# Patient Record
Sex: Male | Born: 1935 | Race: White | Hispanic: No | Marital: Married | Smoking: Former smoker
Health system: Southern US, Community
[De-identification: ages and names within clinical notes are randomized; demographics above are authoritative.]

## PROBLEM LIST (undated history)

## (undated) DIAGNOSIS — E785 Hyperlipidemia, unspecified: Secondary | ICD-10-CM

## (undated) DIAGNOSIS — I77810 Thoracic aortic ectasia: Secondary | ICD-10-CM

## (undated) DIAGNOSIS — K5790 Diverticulosis of intestine, part unspecified, without perforation or abscess without bleeding: Secondary | ICD-10-CM

## (undated) DIAGNOSIS — N529 Male erectile dysfunction, unspecified: Secondary | ICD-10-CM

## (undated) DIAGNOSIS — E039 Hypothyroidism, unspecified: Secondary | ICD-10-CM

## (undated) DIAGNOSIS — Z9289 Personal history of other medical treatment: Secondary | ICD-10-CM

## (undated) DIAGNOSIS — I499 Cardiac arrhythmia, unspecified: Secondary | ICD-10-CM

## (undated) DIAGNOSIS — I493 Ventricular premature depolarization: Secondary | ICD-10-CM

## (undated) DIAGNOSIS — H9319 Tinnitus, unspecified ear: Secondary | ICD-10-CM

## (undated) DIAGNOSIS — I2510 Atherosclerotic heart disease of native coronary artery without angina pectoris: Secondary | ICD-10-CM

## (undated) DIAGNOSIS — E038 Other specified hypothyroidism: Secondary | ICD-10-CM

## (undated) DIAGNOSIS — N400 Enlarged prostate without lower urinary tract symptoms: Secondary | ICD-10-CM

## (undated) HISTORY — DX: Cardiac arrhythmia, unspecified: I49.9

## (undated) HISTORY — DX: Ventricular premature depolarization: I49.3

## (undated) HISTORY — DX: Male erectile dysfunction, unspecified: N52.9

## (undated) HISTORY — DX: Thoracic aortic ectasia: I77.810

## (undated) HISTORY — DX: Benign prostatic hyperplasia without lower urinary tract symptoms: N40.0

## (undated) HISTORY — DX: Hyperlipidemia, unspecified: E78.5

## (undated) HISTORY — DX: Diverticulosis of intestine, part unspecified, without perforation or abscess without bleeding: K57.90

## (undated) HISTORY — DX: Atherosclerotic heart disease of native coronary artery without angina pectoris: I25.10

## (undated) HISTORY — DX: Personal history of other medical treatment: Z92.89

## (undated) HISTORY — PX: CARDIAC CATHETERIZATION: SHX172

## (undated) HISTORY — DX: Tinnitus, unspecified ear: H93.19

## (undated) HISTORY — PX: OTHER SURGICAL HISTORY: SHX169

## (undated) HISTORY — DX: Other specified hypothyroidism: E03.8

## (undated) HISTORY — DX: Hypothyroidism, unspecified: E03.9

---

## 2000-02-23 ENCOUNTER — Emergency Department (HOSPITAL_COMMUNITY): Admission: EM | Admit: 2000-02-23 | Discharge: 2000-02-23 | Payer: Self-pay | Admitting: Emergency Medicine

## 2000-09-22 ENCOUNTER — Ambulatory Visit (HOSPITAL_COMMUNITY): Admission: RE | Admit: 2000-09-22 | Discharge: 2000-09-22 | Payer: Self-pay | Admitting: Gastroenterology

## 2009-04-16 ENCOUNTER — Emergency Department (HOSPITAL_COMMUNITY): Admission: EM | Admit: 2009-04-16 | Discharge: 2009-04-16 | Payer: Self-pay | Admitting: Emergency Medicine

## 2010-04-30 ENCOUNTER — Inpatient Hospital Stay (HOSPITAL_COMMUNITY): Admission: RE | Admit: 2010-04-30 | Discharge: 2010-05-01 | Payer: Self-pay | Admitting: Interventional Cardiology

## 2010-12-04 ENCOUNTER — Other Ambulatory Visit: Payer: Self-pay | Admitting: Interventional Cardiology

## 2010-12-04 DIAGNOSIS — I77810 Thoracic aortic ectasia: Secondary | ICD-10-CM

## 2010-12-20 ENCOUNTER — Ambulatory Visit
Admission: RE | Admit: 2010-12-20 | Discharge: 2010-12-20 | Disposition: A | Discharge status: Home / Self Care | Payer: MEDICARE | Source: Ambulatory Visit | Attending: Interventional Cardiology | Admitting: Interventional Cardiology

## 2010-12-20 DIAGNOSIS — I77810 Thoracic aortic ectasia: Secondary | ICD-10-CM

## 2010-12-20 MED ORDER — IOHEXOL 300 MG/ML  SOLN
75.0000 mL | Freq: Once | INTRAMUSCULAR | Status: AC | PRN
  Administered 2010-12-20: 75 mL via INTRAVENOUS

## 2011-01-30 LAB — PROTIME-INR
INR: 1.57 — ABNORMAL HIGH (ref 0.00–1.49)
Prothrombin Time: 16.9 seconds — ABNORMAL HIGH (ref 11.6–15.2)
Prothrombin Time: 18.6 seconds — ABNORMAL HIGH (ref 11.6–15.2)

## 2011-01-30 LAB — BASIC METABOLIC PANEL
BUN: 17 mg/dL (ref 6–23)
CO2: 27 mEq/L (ref 19–32)
Calcium: 8.6 mg/dL (ref 8.4–10.5)
Chloride: 106 mEq/L (ref 96–112)
Creatinine, Ser: 1.15 mg/dL (ref 0.4–1.5)
GFR calc Af Amer: >60 mL/min (ref >60)
GFR calc non Af Amer: >60 mL/min (ref >60)
Glucose, Bld: 104 mg/dL — ABNORMAL HIGH (ref 70–99)
Potassium: 3.8 mEq/L (ref 3.5–5.1)
Sodium: 139 mEq/L (ref 135–145)

## 2011-01-30 LAB — CBC
HCT: 40.8 % (ref 39.0–52.0)
Platelets: 152 10*3/uL (ref 150–400)
WBC: 6.1 10*3/uL (ref 4.0–10.5)

## 2011-06-27 ENCOUNTER — Other Ambulatory Visit: Payer: Self-pay | Admitting: Interventional Cardiology

## 2011-06-27 DIAGNOSIS — R918 Other nonspecific abnormal finding of lung field: Secondary | ICD-10-CM

## 2011-12-26 ENCOUNTER — Other Ambulatory Visit: Payer: Self-pay

## 2011-12-26 ENCOUNTER — Ambulatory Visit
Admission: RE | Admit: 2011-12-26 | Discharge: 2011-12-26 | Disposition: A | Discharge status: Home / Self Care | Payer: Medicare Other | Source: Ambulatory Visit | Attending: Interventional Cardiology | Admitting: Interventional Cardiology

## 2011-12-26 DIAGNOSIS — R918 Other nonspecific abnormal finding of lung field: Secondary | ICD-10-CM

## 2011-12-26 MED ORDER — IOHEXOL 300 MG/ML  SOLN
75.0000 mL | Freq: Once | INTRAMUSCULAR | Status: AC | PRN
  Administered 2011-12-26: 75 mL via INTRAVENOUS

## 2013-05-31 IMAGING — CT CT CHEST W/ CM
3 of 4 series · 15 of 30 positions shown, 16 images · IV contrast (omnipaque)
Comparison: CT chest of 12/20/2010

CLINICAL DATA: Evaluate prominent ascending aorta

CT CHEST WITH CONTRAST
TECHNIQUE: Multidetector CT imaging of the chest was performed
following the standard protocol during bolus administration of
intravenous contrast.
Contrast: 75mL OMNIPAQUE IOHEXOL 300 MG/ML IV SOLN

[Series 3: chest with · axial · 0.78mm/px · z∈[-218,-42]mm · 4 of 59 slices shown, 5 images]
[im 12/59  mediastinal]
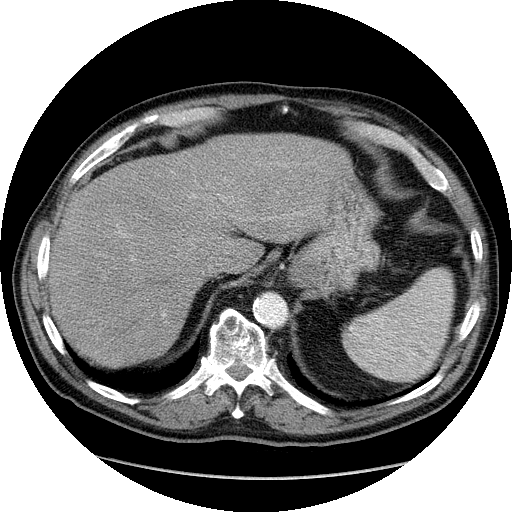
[im 12/59  lung]
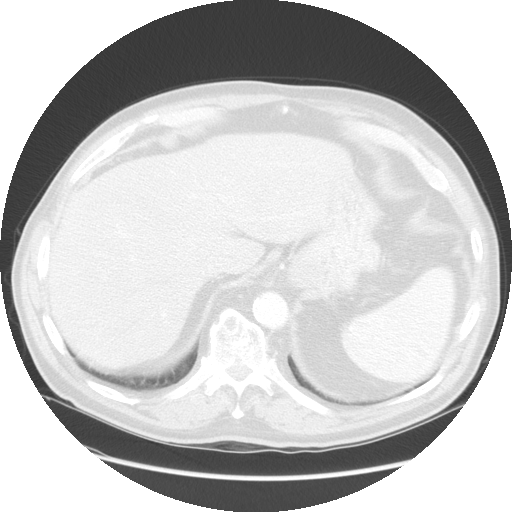
[im 24/59  lung]
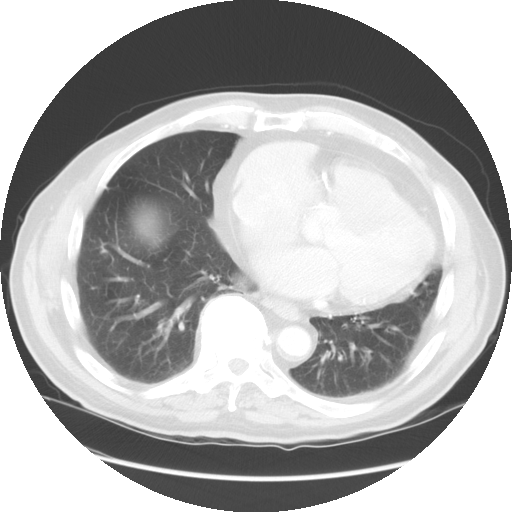
[im 35/59  lung]
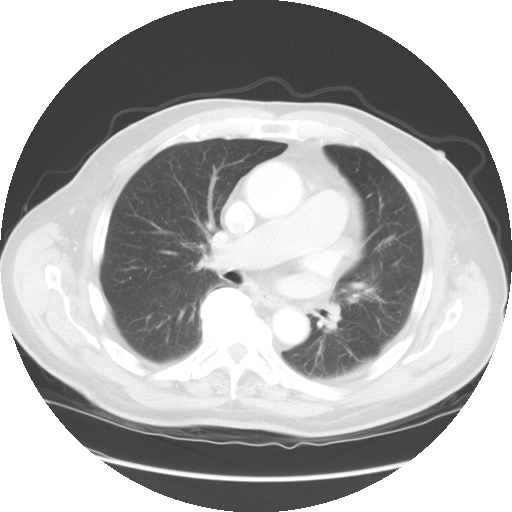
[im 47/59  lung]
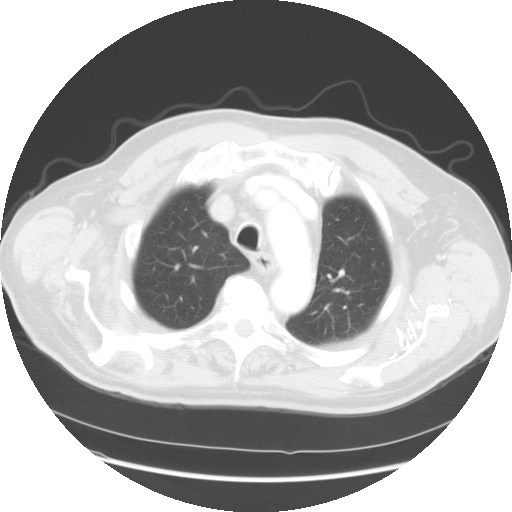

[Series 4: lung windows · axial · 0.78mm/px · z∈[-178,-48]mm · 3 of 53 slices shown]
[im 14/53  lung]
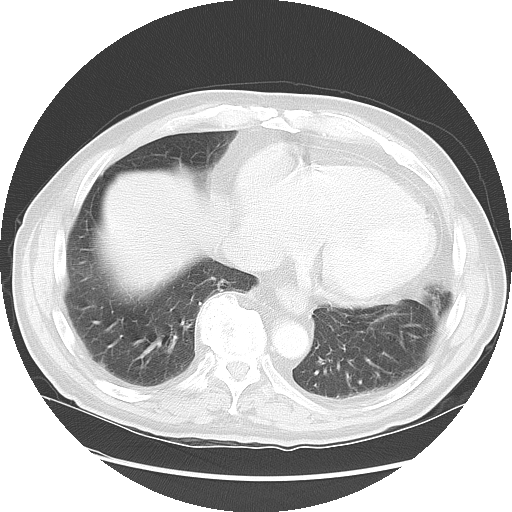
[im 27/53  lung]
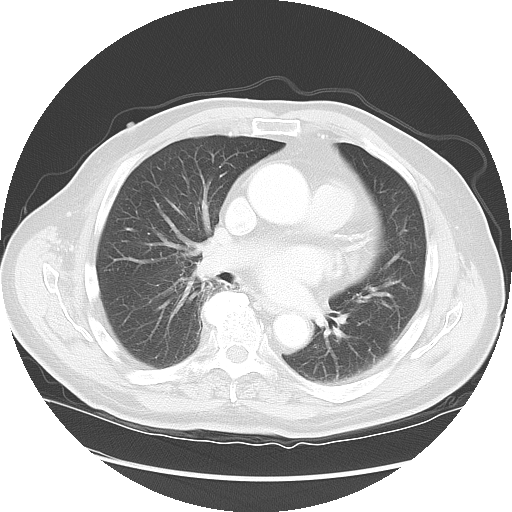
[im 40/53  lung]
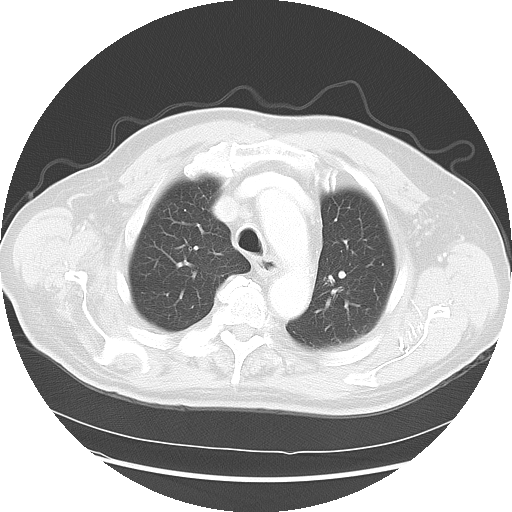

[Series 602: sagittal body · sagittal · 0.78mm/px · 8 of 161 slices shown]
[im 11/161  mediastinal]
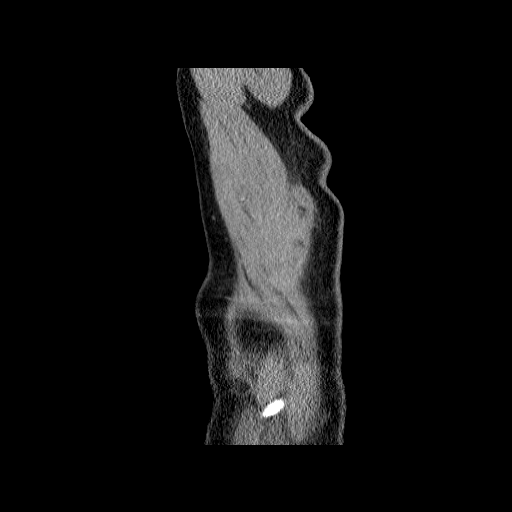
[im 31/161  mediastinal]
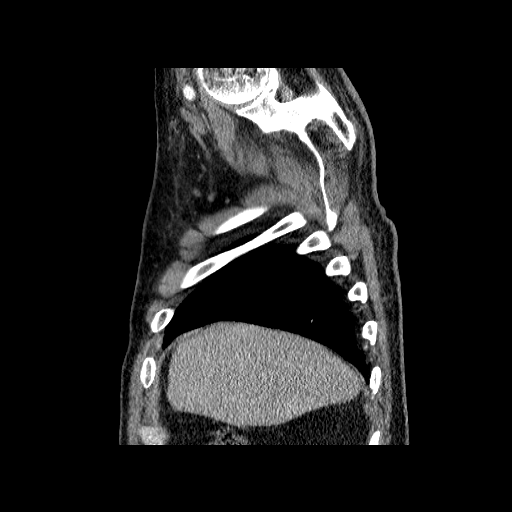
[im 51/161  mediastinal]
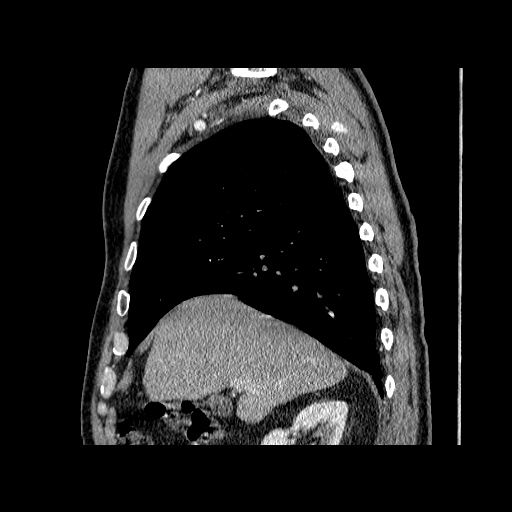
[im 71/161  mediastinal]
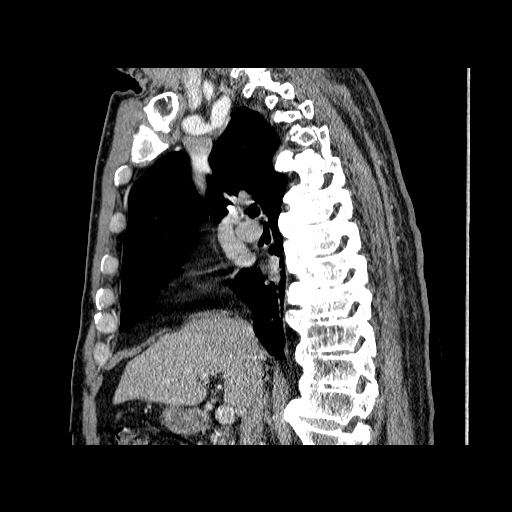
[im 91/161  mediastinal]
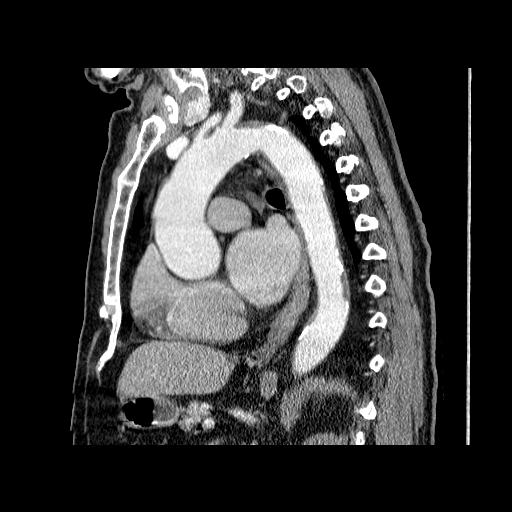
[im 111/161  mediastinal]
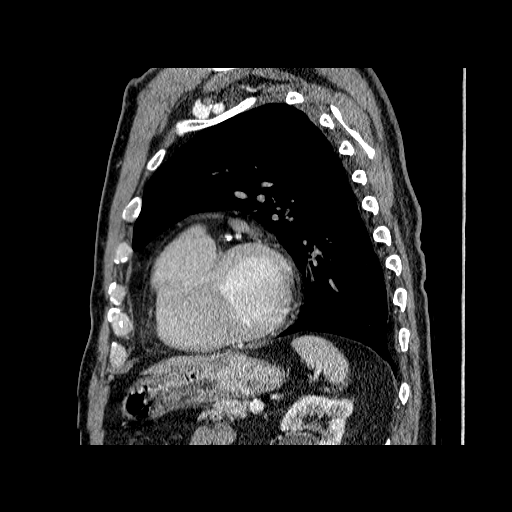
[im 131/161  mediastinal]
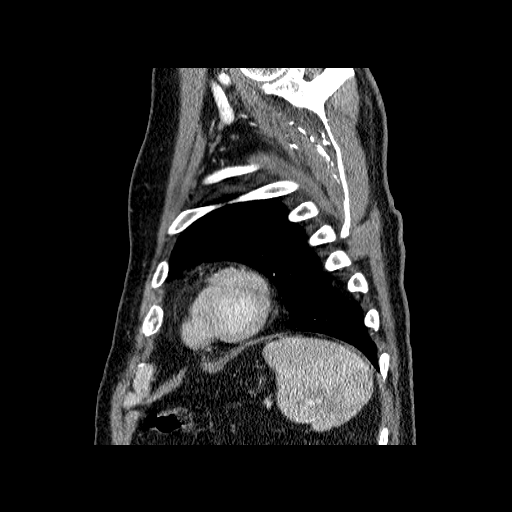
[im 151/161  mediastinal]
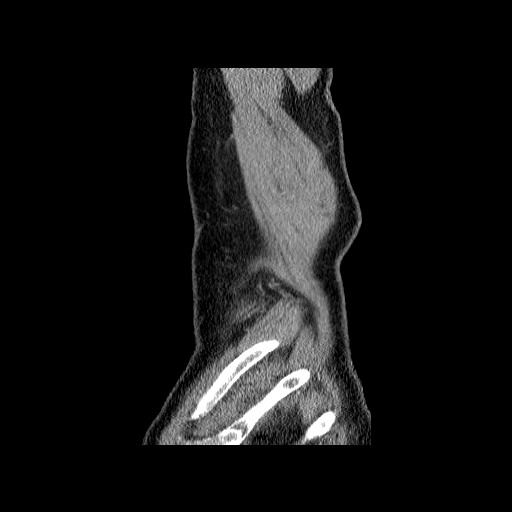

[15 of 30 positions shown; findings below may reference images not displayed]

FINDINGS: The slight fusiform prominence of the ascending aorta
appears stable with the ascending aorta measuring 44 mm compared to
45 mm on the prior study.  The aortic arch is normal in caliber
with some calcification of the aortic arch.  The origins of the
great vessels are patent.  Some atheromatous change is noted within
the descending thoracic aorta distally.  The pulmonary arteries
opacify with no acute abnormality and the pulmonary arteries are
again noted to be somewhat prominent suggesting a degree of
pulmonary arterial hypertension.  Faint coronary artery
calcifications are noted and cardiomegaly is stable.  No
mediastinal or hilar adenopathy is seen.  The thyroid gland is
normal in size and there is a small low attenuation nodule in the
left lobe of questionable significance in this age patient.

On the lung window images, no focal infiltrate is seen.
Bronchiectatic change is again noted within the right middle lobe
as previously described.  No pleural effusion is seen. There are
diffuse degenerative changes throughout the entire thoracic spine.
IMPRESSION: 1.  Stable slight fusiform prominence of the ascending aorta with
maximum diameter of 44 mm.
2.  Cardiomegaly and coronary artery calcifications.
3.  No change in bronchiectatic changes within the right middle
lobe as previously noted.

## 2013-12-03 ENCOUNTER — Other Ambulatory Visit: Payer: Self-pay | Admitting: Internal Medicine

## 2013-12-03 DIAGNOSIS — M25569 Pain in unspecified knee: Secondary | ICD-10-CM

## 2014-01-27 ENCOUNTER — Ambulatory Visit: Payer: Medicare Other | Admitting: Interventional Cardiology

## 2014-02-04 ENCOUNTER — Ambulatory Visit (INDEPENDENT_AMBULATORY_CARE_PROVIDER_SITE_OTHER): Payer: Medicare Other | Admitting: Interventional Cardiology

## 2014-02-04 ENCOUNTER — Encounter: Payer: Self-pay | Admitting: Interventional Cardiology

## 2014-02-04 ENCOUNTER — Encounter: Payer: Self-pay | Admitting: Cardiology

## 2014-02-04 VITALS — BP 130/78 | Ht 67.0 in | Wt 186.0 lb

## 2014-02-04 DIAGNOSIS — I4891 Unspecified atrial fibrillation: Secondary | ICD-10-CM

## 2014-02-04 DIAGNOSIS — I2510 Atherosclerotic heart disease of native coronary artery without angina pectoris: Secondary | ICD-10-CM

## 2014-02-04 DIAGNOSIS — E782 Mixed hyperlipidemia: Secondary | ICD-10-CM

## 2014-02-04 DIAGNOSIS — N400 Enlarged prostate without lower urinary tract symptoms: Secondary | ICD-10-CM | POA: Insufficient documentation

## 2014-02-04 MED ORDER — METOPROLOL TARTRATE 50 MG PO TABS: 50.0000 mg | ORAL_TABLET | Freq: Two times a day (BID) | ORAL | Status: DC

## 2014-02-04 NOTE — Patient Instructions (Signed)
Your physician has recommended you make the following change in your medication:   1. Restart Metoprolol 50 mg 1 tablet twice a day.  Your physician recommends that you return for a FASTING lipid profile and hepatic panel 02/06/14.  Your physician wants you to follow-up in: 1 year with Dr. Eldridge DaceVaranasi. You will receive a reminder letter in the mail two months in advance. If you don't receive a letter, please call our office to schedule the follow-up appointment.

## 2014-02-04 NOTE — Progress Notes (Signed)
Patient ID: Les P Gemme, male   DOB: 03/25/1930, 80 y.o.   MRN: [id number]    [address] [place], [place]  [address] [address] Phone: [phone] Fax:  [phone]  Date:  02/04/2014   ID:  Scharlene CornSpiros P Eicher, DOB 06/12/1930, MRN [id number]  PCP:  Default, Provider, MD      History of Present Illness: Kaidin P Fraser Dinixon is a 80 y.o. male who has had CAD, AFib. Atrial Fibrillation F/U:  Denies : Chest pain.  Dizziness.  Leg edema.  Orthopnea.  Palpitations.  Shortness of breath.  Syncope.   Still working, exercising and travelling.    Wt Readings from Last 3 Encounters:  02/04/14 186 lb (84.369 kg)     Past Medical History  Diagnosis Date  . Coronary artery disease     2 BMS STENTS LAD AND RCA 2011  . 427.31     afib  . BPH (benign prostatic hyperplasia)     Dr. Retta Dionesahlstedt  . Ascending aorta dilation   . Diverticulosis   . Hyperlipidemia     borderline  . History of echocardiogram     2007 Normal LV size, mild LVH, aortic valve sclerosis, mild diastolic relaxation abn and mild aortic root dilation  . PVC's (premature ventricular contractions)   . History of nuclear stress test     negative-done because of dyspnea  . Tinnitus     sees ENT  . ED (erectile dysfunction)   . Subclinical hypothyroidism     Current Outpatient Prescriptions  Medication Sig Dispense Refill  . aspirin 81 MG tablet Take 81 mg by mouth daily.      . cholecalciferol (VITAMIN D) 1000 UNITS tablet Take 1,000 Units by mouth daily.      Marland Kitchen. levothyroxine (SYNTHROID, LEVOTHROID) 25 MCG tablet Take 25 mcg by mouth daily before breakfast.       . lisinopril (PRINIVIL,ZESTRIL) 5 MG tablet Take 5 mg by mouth daily.       . nitroGLYCERIN (NITROSTAT) 0.4 MG SL tablet Place 0.4 mg under the tongue every 5 (five) minutes as needed for chest pain.      . simvastatin (ZOCOR) 20 MG tablet Take 20 mg by mouth daily at 6 PM.       . vitamin B-12 (CYANOCOBALAMIN) 1000 MCG tablet Take 1,000 mcg by mouth daily.       Marland Kitchen. warfarin (COUMADIN) 5 MG tablet Take 5 mg by mouth as directed.        No current facility-administered medications for this visit.    Allergies:   No Known Allergies  Social History:  The patient  reports that he has quit smoking. He does not have any smokeless tobacco history on file. He reports that he does not drink alcohol.   Family History:  The patient's family history includes Alcoholism in his father.   ROS:  Please see the history of present illness.  No nausea, vomiting.  No fevers, chills.  No focal weakness.  No dysuria.   All other systems reviewed and negative.   PHYSICAL EXAM: VS:  BP 130/78  Ht 5\' 7"  (1.702 m)  Wt 186 lb (84.369 kg)  BMI 29.12 kg/m2 Well nourished, well developed, in no acute distress HEENT: normal Neck: no JVD, no carotid bruits Cardiac:  normal S1, S2; irregularly irregular Lungs:  clear to auscultation bilaterally, no wheezing, rhonchi or rales Abd: soft, nontender, no hepatomegaly Ext: no edema Skin: warm and dry Neuro:   no focal abnormalities  noted  EKG:  AFib, rate controlled   ASSESSMENT AND PLAN:  Atrial fibrillation  Continue Warfarin Sodium Tablet, 5 MG, as directed, Orally, 2.5 mg on Sat/Tues, Thurs, 5mg  other days Refill Metoprolol Tartrate Tablet, 50.0 Milligram, TAKE 1 TABLET BY MOUTH TWICE DAILY, 90 days, 180, Refills 3 IMAGING: EKG   Harward,Amy 01/28/2013 01:41:59 PM > Tiffiany Beadles,JAY 01/28/2013 01:57:58 PM > AFib, controlled rate  No change from 2014 ECG Notes: Rate controlled. COumadin for stroke prevention.  2. Coronary atherosclerosis of native coronary artery  Continue Nitroglycerin 0.4 mg tablet, 0.4 mg, 1 tablet as directed, SL, as directed prn chest pain, 1 vial, Refills 6 Notes: No angina.  3. Mixed hyperlipidemia  Refill Simvastatin Tablet, 20 Milligram, TAKE 1 TABLET BY MOUTH DAILY, oral, daily, 90 days, 90, Refills 3 LAB: Statin Panel (Ordered for 02/04/2013)  ALP 49  38-126 - U/L   ALT 13  0-52 - U/L    AST 13  0-39 - U/L   CHOLESTEROL 164  <200 - mg/dL   TRIG 91  1-610 - mg/dL   DLDL 960 H 4-54 - mg/dL   NON-HDL 098  1-191 - mg/dL   DIRECT HDL 44  47-82 - mg/dL   CHOL/HDL 3.7  9.5-6.2 - Ratio    Levie Owensby,JAY 02/01/2013 04:14:20 PM > stable. continue current meds Harward,Amy 02/01/2013 04:32:42 PM > Pt notified.   Notes: Last LDL 100. Target less than 100. He will try to increase exercise as weather gets warmer.   Will recheck lipids at this time  Signed, Fredric Mare, MD, [facility] 02/04/2014 2:14 PM

## 2014-02-06 ENCOUNTER — Other Ambulatory Visit (INDEPENDENT_AMBULATORY_CARE_PROVIDER_SITE_OTHER): Payer: Medicare Other

## 2014-02-06 DIAGNOSIS — E782 Mixed hyperlipidemia: Secondary | ICD-10-CM

## 2014-02-06 DIAGNOSIS — I4891 Unspecified atrial fibrillation: Secondary | ICD-10-CM

## 2014-02-06 LAB — LIPID PANEL
CHOL/HDL RATIO: 4
Cholesterol: 159 mg/dL (ref 0–200)
HDL: 39.4 mg/dL (ref >39.00)
LDL Cholesterol: 96 mg/dL (ref 0–99)
Triglycerides: 116 mg/dL (ref 0.0–149.0)
VLDL: 23.2 mg/dL (ref 0.0–40.0)

## 2014-02-06 LAB — HEPATIC FUNCTION PANEL
ALT: 20 U/L (ref 0–53)
AST: 20 U/L (ref 0–37)
Albumin: 3.7 g/dL (ref 3.5–5.2)
Alkaline Phosphatase: 46 U/L (ref 39–117)
BILIRUBIN DIRECT: 0.1 mg/dL (ref 0.0–0.3)
BILIRUBIN TOTAL: 0.5 mg/dL (ref 0.3–1.2)
Total Protein: 6.9 g/dL (ref 6.0–8.3)

## 2014-02-10 ENCOUNTER — Other Ambulatory Visit: Payer: Self-pay | Admitting: Cardiology

## 2014-02-10 ENCOUNTER — Other Ambulatory Visit: Payer: Self-pay

## 2014-02-10 ENCOUNTER — Encounter: Payer: Self-pay | Admitting: Cardiology

## 2014-02-10 DIAGNOSIS — E782 Mixed hyperlipidemia: Secondary | ICD-10-CM

## 2014-02-10 MED ORDER — SIMVASTATIN 20 MG PO TABS: 20.0000 mg | ORAL_TABLET | Freq: Every day | ORAL | Status: DC

## 2014-02-10 MED ORDER — LISINOPRIL 5 MG PO TABS: 5.0000 mg | ORAL_TABLET | Freq: Every day | ORAL | Status: DC

## 2014-06-19 ENCOUNTER — Encounter: Payer: Self-pay | Admitting: Interventional Cardiology

## 2014-06-19 DIAGNOSIS — I2510 Atherosclerotic heart disease of native coronary artery without angina pectoris: Secondary | ICD-10-CM | POA: Insufficient documentation

## 2014-06-19 DIAGNOSIS — H9319 Tinnitus, unspecified ear: Secondary | ICD-10-CM | POA: Insufficient documentation

## 2014-06-19 DIAGNOSIS — I493 Ventricular premature depolarization: Secondary | ICD-10-CM | POA: Insufficient documentation

## 2014-06-19 DIAGNOSIS — E039 Hypothyroidism, unspecified: Secondary | ICD-10-CM | POA: Insufficient documentation

## 2014-06-19 DIAGNOSIS — N529 Male erectile dysfunction, unspecified: Secondary | ICD-10-CM | POA: Insufficient documentation

## 2014-06-19 DIAGNOSIS — E038 Other specified hypothyroidism: Secondary | ICD-10-CM | POA: Insufficient documentation

## 2015-01-29 ENCOUNTER — Other Ambulatory Visit: Payer: Self-pay | Admitting: Interventional Cardiology

## 2015-02-05 ENCOUNTER — Ambulatory Visit (INDEPENDENT_AMBULATORY_CARE_PROVIDER_SITE_OTHER): Payer: Medicare Other | Admitting: Interventional Cardiology

## 2015-02-05 ENCOUNTER — Other Ambulatory Visit (INDEPENDENT_AMBULATORY_CARE_PROVIDER_SITE_OTHER): Payer: Medicare Other | Admitting: *Deleted

## 2015-02-05 ENCOUNTER — Encounter: Payer: Self-pay | Admitting: Interventional Cardiology

## 2015-02-05 VITALS — BP 118/62 | HR 57 | Ht 66.0 in | Wt 192.0 lb

## 2015-02-05 DIAGNOSIS — E782 Mixed hyperlipidemia: Secondary | ICD-10-CM

## 2015-02-05 DIAGNOSIS — I4891 Unspecified atrial fibrillation: Secondary | ICD-10-CM | POA: Diagnosis not present

## 2015-02-05 DIAGNOSIS — I493 Ventricular premature depolarization: Secondary | ICD-10-CM | POA: Diagnosis not present

## 2015-02-05 DIAGNOSIS — I2510 Atherosclerotic heart disease of native coronary artery without angina pectoris: Secondary | ICD-10-CM

## 2015-02-05 LAB — HEPATIC FUNCTION PANEL
ALT: 16 U/L (ref 0–53)
AST: 17 U/L (ref 0–37)
Albumin: 3.6 g/dL (ref 3.5–5.2)
Alkaline Phosphatase: 45 U/L (ref 39–117)
BILIRUBIN DIRECT: 0.1 mg/dL (ref 0.0–0.3)
TOTAL PROTEIN: 6.9 g/dL (ref 6.0–8.3)
Total Bilirubin: 0.6 mg/dL (ref 0.2–1.2)

## 2015-02-05 LAB — LIPID PANEL
Cholesterol: 145 mg/dL (ref 0–200)
HDL: 45.3 mg/dL (ref >39.00)
LDL Cholesterol: 84 mg/dL (ref 0–99)
NONHDL: 99.7
Total CHOL/HDL Ratio: 3
Triglycerides: 81 mg/dL (ref 0.0–149.0)
VLDL: 16.2 mg/dL (ref 0.0–40.0)

## 2015-02-05 MED ORDER — METOPROLOL TARTRATE 25 MG PO TABS: 25.0000 mg | ORAL_TABLET | Freq: Two times a day (BID) | ORAL | Status: DC

## 2015-02-05 NOTE — Progress Notes (Signed)
Patient ID: Scharlene CornSpiros P Tate, male   DOB: 10/10/1930, 81 y.o.   MRN: [id number]    [address] [place], [place]  [address] Phone: [phone] Fax:  [phone]  Date:  02/05/2015   ID:  Scharlene CornSpiros P Tate, DOB 01/29/1930, MRN [id number]  PCP:  Lillia MountainGRIFFIN,JOHN JOSEPH, MD      History of Present Illness: Ryan Tate is a 81 y.o. male who has had CAD, AFib. Atrial Fibrillation F/U:  Denies : Chest pain.  Dizziness.  Leg edema.  Orthopnea.  Palpitations.  Shortness of breath.  Syncope.   Still working, exercising and traveling. He has had a right sided nose bleed that is mild.  Never had to go to the ER.  Still swimming.  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    His wife passed away in Dec 2015 from a stroke.  THis has been a source of stress for him.  He is dealing with it easonably well.  He is somewhat tearful when speaking of her.  THere were married for nearly 60 years.     He is planning a trip to [facility] [place].  Wt Readings from Last 3 Encounters:  02/05/15 192 lb (87.091 kg)  02/04/14 186 lb (84.369 kg)     Past Medical History  Diagnosis Date  . Coronary artery disease     2 BMS STENTS LAD AND RCA 2011  . 427.31     afib  . BPH (benign prostatic hyperplasia)     Dr. Retta Dionesahlstedt  . Ascending aorta dilation   . Diverticulosis   . Hyperlipidemia     borderline  . History of echocardiogram     2007 Normal LV size, mild LVH, aortic valve sclerosis, mild diastolic relaxation abn and mild aortic root dilation  . PVC's (premature ventricular contractions)   . History of nuclear stress test     negative-done because of dyspnea  . Tinnitus     sees ENT  . ED (erectile dysfunction)   . Subclinical hypothyroidism     Current Outpatient Prescriptions  Medication Sig Dispense Refill  . aspirin 81 MG tablet Take 81 mg by mouth daily.    . cholecalciferol (VITAMIN D) 1000 UNITS tablet Take 1,000 Units by mouth daily.    Marland Kitchen. levothyroxine (SYNTHROID,  LEVOTHROID) 25 MCG tablet Take 25 mcg by mouth daily before breakfast.     . lisinopril (PRINIVIL,ZESTRIL) 5 MG tablet TAKE ONE TABLET BY MOUTH ONCE DAILY 90 tablet 0  . metoprolol (LOPRESSOR) 50 MG tablet TAKE ONE TABLET BY MOUTH TWICE DAILY 180 tablet 0  . nitroGLYCERIN (NITROSTAT) 0.4 MG SL tablet Place 0.4 mg under the tongue every 5 (five) minutes as needed for chest pain.    . simvastatin (ZOCOR) 20 MG tablet TAKE ONE TABLET BY MOUTH ONCE DAILY AT  6  PM 90 tablet 0  . vitamin B-12 (CYANOCOBALAMIN) 1000 MCG tablet Take 1,000 mcg by mouth daily.    Marland Kitchen. warfarin (COUMADIN) 5 MG tablet Take 5 mg by mouth as directed.      No current facility-administered medications for this visit.    Allergies:   No Known Allergies  Social History:  The patient  reports that he quit smoking about 43 years ago. He has never used smokeless tobacco. He reports that he does not drink alcohol.   Family History:  The patient's family history includes Alcoholism in his father.   ROS:  Please see the history of  present illness.  No nausea, vomiting.  No fevers, chills.  No focal weakness.  No dysuria.   All other systems reviewed and negative.   PHYSICAL EXAM: VS:  BP 118/62 mmHg  Pulse 57  Ht  (1.676 m)  Wt 192 lb (87.091 kg)  BMI 31.00 kg/m2 Well nourished, well developed, in no acute distress HEENT: normal Neck: no JVD, no carotid bruits Cardiac:  normal S1, S2; irregularly irregular Lungs:  clear to auscultation bilaterally, no wheezing, rhonchi or rales Abd: soft, nontender, no hepatomegaly Ext: no edema Skin: warm and dry Neuro:   no focal abnormalities noted Psych: normal affect  EKG:  AFib, rate controlled   ASSESSMENT AND PLAN:  Atrial fibrillation  Continue Warfarin Sodium Tablet, 5 MG, as directed, Orally, 2.5 mg on Sat/Tues, Thurs,  other days Decrease Metoprolol Tartrate Tablet, 25 Milligram, TAKE 1 TABLET BY MOUTH TWICE DAILY, 90 days, 180, Refills 3   2014 ECG Notes: Rate  controlled. Coumadin for stroke prevention.  2. Coronary atherosclerosis of native coronary artery  Continue Nitroglycerin 0.4 mg tablet, 0.4 mg, 1 tablet as directed, SL, as directed prn chest pain, 1 vial, Refills 6 Notes: No angina.  3. Mixed hyperlipidemia  Continue Simvastatin Tablet, 20 Milligram, TAKE 1 TABLET BY MOUTH DAILY, oral, daily, 90 days, 90, Refills 3   lipids today 02/05/15: LDL 84, TG 81, HDL 45  Signed, Fredric Mare, MD, [facility] 02/05/2015 3:26 PM

## 2015-02-05 NOTE — Patient Instructions (Addendum)
Your physician has recommended you make the following change in your medication:  1) DECREASE Metoprolol to 25 mg twice a day.  Your physician recommends that you return for lab work in: 1 year (FASTing Lipid/Liver)  Your physician wants you to follow-up in: 1 year with Dr. Eldridge DaceVaranasi. You will receive a reminder letter in the mail two months in advance. If you don't receive a letter, please call our office to schedule the follow-up appointment.

## 2015-02-09 ENCOUNTER — Other Ambulatory Visit: Payer: Medicare Other

## 2015-02-11 ENCOUNTER — Telehealth: Payer: Self-pay | Admitting: Interventional Cardiology

## 2015-02-11 NOTE — Telephone Encounter (Signed)
Returned pt's call. Gave pt lab results. Pt verbalized understanding.

## 2015-02-11 NOTE — Telephone Encounter (Signed)
Follow Up ° °Pt returned call//  °

## 2015-04-21 ENCOUNTER — Other Ambulatory Visit: Payer: Self-pay | Admitting: Interventional Cardiology

## 2015-04-30 ENCOUNTER — Other Ambulatory Visit: Payer: Self-pay | Admitting: Interventional Cardiology

## 2016-01-16 ENCOUNTER — Other Ambulatory Visit: Payer: Self-pay | Admitting: Interventional Cardiology

## 2016-04-17 ENCOUNTER — Other Ambulatory Visit: Payer: Self-pay | Admitting: Interventional Cardiology

## 2016-04-19 ENCOUNTER — Telehealth: Payer: Self-pay | Admitting: Interventional Cardiology

## 2016-04-19 ENCOUNTER — Other Ambulatory Visit: Payer: Self-pay | Admitting: *Deleted

## 2016-04-19 MED ORDER — METOPROLOL TARTRATE 25 MG PO TABS: 25.0000 mg | ORAL_TABLET | Freq: Two times a day (BID) | ORAL | Status: DC

## 2016-04-19 MED ORDER — SIMVASTATIN 20 MG PO TABS: 20.0000 mg | ORAL_TABLET | Freq: Every day | ORAL | Status: DC

## 2016-04-19 MED ORDER — LISINOPRIL 5 MG PO TABS: 5.0000 mg | ORAL_TABLET | Freq: Every day | ORAL | Status: DC

## 2016-04-19 NOTE — Telephone Encounter (Signed)
Walk in pt form-patient is asking for another prescription before he goes out of town-Lynn V back in office Wednesday 6/7/

## 2016-05-29 ENCOUNTER — Other Ambulatory Visit: Payer: Self-pay | Admitting: Interventional Cardiology

## 2016-06-16 ENCOUNTER — Ambulatory Visit (INDEPENDENT_AMBULATORY_CARE_PROVIDER_SITE_OTHER): Payer: Medicare Other | Admitting: Interventional Cardiology

## 2016-06-16 ENCOUNTER — Encounter (INDEPENDENT_AMBULATORY_CARE_PROVIDER_SITE_OTHER): Payer: Self-pay

## 2016-06-16 ENCOUNTER — Encounter: Payer: Self-pay | Admitting: Interventional Cardiology

## 2016-06-16 VITALS — BP 128/60 | HR 64 | Ht 66.0 in | Wt 188.0 lb

## 2016-06-16 DIAGNOSIS — I2510 Atherosclerotic heart disease of native coronary artery without angina pectoris: Secondary | ICD-10-CM

## 2016-06-16 DIAGNOSIS — I481 Persistent atrial fibrillation: Secondary | ICD-10-CM

## 2016-06-16 DIAGNOSIS — I493 Ventricular premature depolarization: Secondary | ICD-10-CM

## 2016-06-16 DIAGNOSIS — E782 Mixed hyperlipidemia: Secondary | ICD-10-CM

## 2016-06-16 DIAGNOSIS — I4819 Other persistent atrial fibrillation: Secondary | ICD-10-CM

## 2016-06-16 NOTE — Patient Instructions (Signed)
**Note De-identified Ryan Tate Obfuscation** Medication Instructions:  Same-no changes  Labwork: None  Testing/Procedures: None  Follow-Up: Your physician wants you to follow-up in: 1 year. You will receive a reminder letter in the mail two months in advance. If you don't receive a letter, please call our office to schedule the follow-up appointment.      If you need a refill on your cardiac medications before your next appointment, please call your pharmacy.   

## 2016-06-16 NOTE — Progress Notes (Signed)
Cardiology Office Note   Date:  06/16/2016   ID:  Ryan Tate, DOB [birth year], MRN [id number]  PCP:  Lillia Mountain, MD    No chief complaint on file. CAD, AFib   Wt Readings from Last 3 Encounters:  06/16/16 188 lb (85.3 kg)  02/05/15 192 lb (87.1 kg)  02/04/14 186 lb (84.4 kg)       History of Present Illness: Ryan Tate is a 82 y.o. male who has had CAD and AFib.  He had stents placed in 2011.  He has been rate controlled and anticoagulated with Coumadin.  No chest pain or SOB.  He continues to exercise.  He continues to work.  No bleeding problems.    Still swimming.  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    His wife passed away in 01/04/16from a stroke. He continues to travel and was recently in [facility].  H/o PVCs.    Past Medical History:  Diagnosis Date  . 427.31    afib  . Ascending aorta dilation (HCC)   . BPH (benign prostatic hyperplasia)    Dr. Retta Diones  . Coronary artery disease    2 BMS STENTS LAD AND RCA 2011  . Diverticulosis   . ED (erectile dysfunction)   . History of echocardiogram    2007 Normal LV size, mild LVH, aortic valve sclerosis, mild diastolic relaxation abn and mild aortic root dilation  . History of nuclear stress test    negative-done because of dyspnea  . Hyperlipidemia    borderline  . PVC's (premature ventricular contractions)   . Subclinical hypothyroidism   . Tinnitus    sees ENT    Past Surgical History:  Procedure Laterality Date  . CARDIAC CATHETERIZATION     bms stents LAD and RCA 2011  . cataracts in each eye    . dental implants       Current Outpatient Prescriptions  Medication Sig Dispense Refill  . aspirin 81 MG tablet Take 81 mg by mouth daily.    . cholecalciferol (VITAMIN D) 1000 UNITS tablet Take 1,000 Units by mouth daily.    Marland Kitchen levothyroxine (SYNTHROID, LEVOTHROID) 25 MCG tablet Take 25 mcg by mouth daily before breakfast.     . lisinopril (PRINIVIL,ZESTRIL) 5  MG tablet Take 1 tablet (5 mg total) by mouth daily. Patient needs to keep 06/16/16 appointment for further refills to be granted 30 tablet 0  . lisinopril (PRINIVIL,ZESTRIL) 5 MG tablet Take 1 tablet (5 mg total) by mouth daily. Please keep 06/16/16 appointment for further refills 15 tablet 0  . metoprolol tartrate (LOPRESSOR) 25 MG tablet Take 1 tablet (25 mg total) by mouth 2 (two) times daily. Patient needs to keep 06/16/16 appointment for further refills to be granted 60 tablet 1  . nitroGLYCERIN (NITROSTAT) 0.4 MG SL tablet Place 0.4 mg under the tongue every 5 (five) minutes as needed for chest pain.    . simvastatin (ZOCOR) 20 MG tablet Take 1 tablet (20 mg total) by mouth daily at 6 PM. 30 tablet 1  . vitamin B-12 (CYANOCOBALAMIN) 1000 MCG tablet Take 1,000 mcg by mouth daily.    Marland Kitchen warfarin (COUMADIN) 5 MG tablet Take 5 mg by mouth as directed.      No current facility-administered medications for this visit.     Allergies:   Review of patient's allergies indicates no known allergies.    Social History:  The patient  reports that he  quit smoking about 44 years ago. He quit after 15.00 years of use. He has never used smokeless tobacco. He reports that he does not drink alcohol.   Family History:  The patient's family history includes Alcoholism in his father.    ROS:  Please see the history of present illness.   Otherwise, review of systems are positive for minimal joint pains.   All other systems are reviewed and negative.    PHYSICAL EXAM: VS:  BP 128/60   Pulse 64   Ht 5\' 6"  (1.676 m)   Wt 188 lb (85.3 kg)   BMI 30.34 kg/m  , BMI Body mass index is 30.34 kg/m. GEN: Well nourished, well developed, in no acute distress  HEENT: normal  Neck: no JVD, carotid bruits, or masses Cardiac: irregularly irregular; no murmurs, rubs, or gallops,no edema  Respiratory:  clear to auscultation bilaterally, normal work of breathing GI: soft, nontender, nondistended, + BS MS: no deformity or  atrophy  Skin: warm and dry, no rash Neuro:  Strength and sensation are intact Psych: euthymic mood, full affect   EKG:   The ekg ordered today demonstrates AFib, rate controlled   Recent Labs: No results found for requested labs within last 8760 hours.   Lipid Panel    Component Value Date/Time   CHOL 145 02/05/2015 0735   TRIG 81.0 02/05/2015 0735   HDL 45.30 02/05/2015 0735   CHOLHDL 3 02/05/2015 0735   VLDL 16.2 02/05/2015 0735   LDLCALC 84 02/05/2015 0735     Other studies Reviewed: Additional studies/ records that were reviewed today with results demonstrating: cath results.   ASSESSMENT AND PLAN:  1. CAD: No angina.  S/p 2 vessel PCI in 2011.  He continues to exercise.   2. AFib: Rate controlled.  No bleeding issues.  COumadin for stroke prevention. 3. Anticoagulated: No bleeding problems. 4. Hyperlipidemia: well controlled. Continue current meds.    Current medicines are reviewed at length with the patient today.  The patient concerns regarding his medicines were addressed.  The following changes have been made:  No change  Labs/ tests ordered today include:  No orders of the defined types were placed in this encounter.   Recommend 150 minutes/week of aerobic exercise Low fat, low carb, high fiber diet recommended  Disposition:   FU in 1 year   Signed, Lance Muss, MD  06/16/2016 3:23 PM    [facility] [address] [place], [place], [place]  [address] Phone: [phone]; Fax: [phone]

## 2016-06-20 ENCOUNTER — Other Ambulatory Visit: Payer: Self-pay | Admitting: Interventional Cardiology

## 2016-06-21 ENCOUNTER — Other Ambulatory Visit: Payer: Self-pay

## 2016-06-21 MED ORDER — LISINOPRIL 5 MG PO TABS: 5.0000 mg | ORAL_TABLET | Freq: Every day | ORAL | 3 refills | Status: DC

## 2016-06-27 ENCOUNTER — Other Ambulatory Visit: Payer: Self-pay | Admitting: *Deleted

## 2016-06-27 ENCOUNTER — Other Ambulatory Visit: Payer: Self-pay | Admitting: Interventional Cardiology

## 2016-06-27 MED ORDER — METOPROLOL TARTRATE 25 MG PO TABS: 25.0000 mg | ORAL_TABLET | Freq: Two times a day (BID) | ORAL | 3 refills | Status: DC

## 2016-07-11 ENCOUNTER — Other Ambulatory Visit: Payer: Self-pay | Admitting: Interventional Cardiology

## 2017-02-14 NOTE — Progress Notes (Signed)
Cardiology Office Note   Date:  02/15/2017   ID:  BRYAM TABORDA, DOB [birth year], MRN [id number]  PCP:  Lillia Mountain, MD    No chief complaint on file. CAD, AFib   Wt Readings from Last 3 Encounters:  02/15/17 184 lb (83.5 kg)  06/16/16 188 lb (85.3 kg)  02/05/15 192 lb (87.1 kg)       History of Present Illness: Ryan Tate is a 83 y.o. male who has had CAD and AFib.  He had stents placed in 2011.  He has been rate controlled and anticoagulated with Coumadin.  No chest pain or SOB.  He continues to exercise.  He continues to work 6 days/week.  No bleeding problems.    Still swimming when the weather is warm.  He does military exercises daily like push ups, sit ups.    His wife passed away in 27-Jan-2016from a stroke. He continues to travel and was recently in [facility], 1-2 x/year.  He will be going to [facility] with th extended family.  H/o PVCs.  No palpitations    Past Medical History:  Diagnosis Date  . 427.31    afib  . Ascending aorta dilation (HCC)   . BPH (benign prostatic hyperplasia)    Dr. Retta Diones  . Coronary artery disease    2 BMS STENTS LAD AND RCA 2011  . Diverticulosis   . ED (erectile dysfunction)   . History of echocardiogram    2007 Normal LV size, mild LVH, aortic valve sclerosis, mild diastolic relaxation abn and mild aortic root dilation  . History of nuclear stress test    negative-done because of dyspnea  . Hyperlipidemia    borderline  . PVC's (premature ventricular contractions)   . Subclinical hypothyroidism   . Tinnitus    sees ENT    Past Surgical History:  Procedure Laterality Date  . CARDIAC CATHETERIZATION     bms stents LAD and RCA 2011  . cataracts in each eye    . dental implants       Current Outpatient Prescriptions  Medication Sig Dispense Refill  . aspirin 81 MG tablet Take 81 mg by mouth daily.    . cholecalciferol (VITAMIN D) 1000 UNITS tablet Take 1,000 Units by mouth daily.    Marland Kitchen  levothyroxine (SYNTHROID, LEVOTHROID) 25 MCG tablet Take 25 mcg by mouth daily before breakfast.     . lisinopril (PRINIVIL,ZESTRIL) 5 MG tablet Take 1 tablet (5 mg total) by mouth daily. Please keep 06/16/16 appointment for further refills 90 tablet 3  . metoprolol tartrate (LOPRESSOR) 25 MG tablet Take 1 tablet (25 mg total) by mouth 2 (two) times daily. 180 tablet 3  . nitroGLYCERIN (NITROSTAT) 0.4 MG SL tablet Place 0.4 mg under the tongue every 5 (five) minutes as needed for chest pain.    . simvastatin (ZOCOR) 20 MG tablet Take 1 tablet (20 mg total) by mouth daily at 6 PM. 30 tablet 11  . vitamin B-12 (CYANOCOBALAMIN) 1000 MCG tablet Take 1,000 mcg by mouth daily.    Marland Kitchen warfarin (COUMADIN) 5 MG tablet Take 5 mg by mouth as directed.      No current facility-administered medications for this visit.     Allergies:   Patient has no known allergies.    Social History:  The patient  reports that he quit smoking about 45 years ago. He quit after 15.00 years of use. He has never used smokeless tobacco. He reports that  he does not drink alcohol.   Family History:  The patient's family history includes Alcoholism in his father.    ROS:  Please see the history of present illness.   Otherwise, review of systems are positive for minimal joint pains.   All other systems are reviewed and negative.    PHYSICAL EXAM: VS:  BP 124/72   Pulse 60   Ht  (1.676 m)   Wt 184 lb (83.5 kg)   BMI 29.70 kg/m  , BMI Body mass index is 29.7 kg/m. GEN: Well nourished, well developed, in no acute distress  HEENT: normal  Neck: no JVD, carotid bruits, or masses Cardiac: irregularly irregular; no murmurs, rubs, or gallops,no edema  Respiratory:  clear to auscultation bilaterally, normal work of breathing GI: soft, nontender, nondistended, + BS MS: no deformity or atrophy  Skin: warm and dry, no rash Neuro:  Strength and sensation are intact Psych: euthymic mood, full affect   EKG:   The ekg ordered  today demonstrates AFib, rate controlled, no ST segment changes, PVC   Recent Labs: No results found for requested labs within last 8760 hours.   Lipid Panel    Component Value Date/Time   CHOL 145 02/05/2015 0735   TRIG 81.0 02/05/2015 0735   HDL 45.30 02/05/2015 0735   CHOLHDL 3 02/05/2015 0735   VLDL 16.2 02/05/2015 0735   LDLCALC 84 02/05/2015 0735     Other studies Reviewed: Additional studies/ records that were reviewed today with results demonstrating: cath results.  Bare metal stents placed due to need for long term anticoaguation.   ASSESSMENT AND PLAN:  1. CAD: No angina.  S/p 2 vessel PCI in 2011.  He continues to exercise.  He has decreased red meat intake.  If bleeding issues ocur, could stop aspirin 81 mg daily. 2. AFib: Rate controlled.  No bleeding issues.  COumadin for stroke prevention. 3. Anticoagulated: No bleeding problems.  Avoids alcohol. INR has been easy to control. 4. Hyperlipidemia: well controlled. Continue current meds. Checked with PMD.  He has a physical coming in June. 5. Mild bradycardia on exam.  If he has any slow HR, would decrease to metoprolol 12.5 mg BID.     Current medicines are reviewed at length with the patient today.  The patient concerns regarding his medicines were addressed.  The following changes have been made:  No change  Labs/ tests ordered today include:  No orders of the defined types were placed in this encounter.   Recommend 150 minutes/week of aerobic exercise Low fat, low carb, high fiber diet recommended  Disposition:   FU in 1 year   Signed, Lance Muss, MD  02/15/2017 2:23 PM    [facility] [address] [place], [place], [place]  [address] Phone: [phone]; Fax: [phone]

## 2017-02-15 ENCOUNTER — Ambulatory Visit (INDEPENDENT_AMBULATORY_CARE_PROVIDER_SITE_OTHER): Payer: Medicare Other | Admitting: Interventional Cardiology

## 2017-02-15 ENCOUNTER — Encounter: Payer: Self-pay | Admitting: Interventional Cardiology

## 2017-02-15 ENCOUNTER — Other Ambulatory Visit: Payer: Self-pay

## 2017-02-15 ENCOUNTER — Encounter (INDEPENDENT_AMBULATORY_CARE_PROVIDER_SITE_OTHER): Payer: Self-pay

## 2017-02-15 VITALS — BP 124/72 | HR 60 | Ht 66.0 in | Wt 184.0 lb

## 2017-02-15 DIAGNOSIS — I493 Ventricular premature depolarization: Secondary | ICD-10-CM

## 2017-02-15 DIAGNOSIS — I25119 Atherosclerotic heart disease of native coronary artery with unspecified angina pectoris: Secondary | ICD-10-CM

## 2017-02-15 DIAGNOSIS — E782 Mixed hyperlipidemia: Secondary | ICD-10-CM | POA: Diagnosis not present

## 2017-02-15 DIAGNOSIS — I4819 Other persistent atrial fibrillation: Secondary | ICD-10-CM

## 2017-02-15 DIAGNOSIS — I481 Persistent atrial fibrillation: Secondary | ICD-10-CM

## 2017-02-15 MED ORDER — NITROGLYCERIN 0.4 MG SL SUBL: 0.4000 mg | SUBLINGUAL_TABLET | SUBLINGUAL | 1 refills | Status: DC | PRN

## 2017-02-15 MED ORDER — LISINOPRIL 5 MG PO TABS: 5.0000 mg | ORAL_TABLET | Freq: Every day | ORAL | 3 refills | Status: DC

## 2017-02-15 MED ORDER — METOPROLOL TARTRATE 25 MG PO TABS: 25.0000 mg | ORAL_TABLET | Freq: Two times a day (BID) | ORAL | 3 refills | Status: DC

## 2017-02-15 MED ORDER — SIMVASTATIN 20 MG PO TABS: 20.0000 mg | ORAL_TABLET | Freq: Every day | ORAL | 11 refills | Status: DC

## 2017-02-15 NOTE — Patient Instructions (Signed)

## 2017-05-29 ENCOUNTER — Other Ambulatory Visit: Payer: Self-pay

## 2017-05-29 DIAGNOSIS — E782 Mixed hyperlipidemia: Secondary | ICD-10-CM

## 2017-05-29 MED ORDER — SIMVASTATIN 40 MG PO TABS: 40.0000 mg | ORAL_TABLET | Freq: Every day | ORAL | 3 refills | Status: DC

## 2017-08-29 ENCOUNTER — Other Ambulatory Visit: Payer: Medicare Other

## 2017-12-27 ENCOUNTER — Telehealth: Payer: Self-pay

## 2017-12-27 NOTE — Telephone Encounter (Signed)
   [facility] Pre-operative Risk Assessment    Request for surgical clearance:  1. What type of surgery is being performed? Removal of several fractured maxillary implants and bone grafting  2. When is this surgery scheduled? Pending clearance   3. Are there any medications that need to be held prior to surgery and how long? Please advise   4. Practice name and name of physician performing surgery?  [facility]  5. What is your office phone and fax number? Phone: [phone] Fax: [phone]  6. Anesthesia type (None, local, MAC, general) ? N/a    Velna Ochs 12/27/2017, 5:50 PM  _________________________________________________________________   (provider comments below)

## 2017-12-28 NOTE — Telephone Encounter (Signed)
   Primary Cardiologist:Jayadeep Eldridge DaceVaranasi, MD  Chart reviewed as part of pre-operative protocol coverage. Because of Tadao P Luttrull's past medical history and time since last visit, he/she will require a follow-up visit in order to better assess preoperative cardiovascular risk.  Pre-op covering staff: - Please schedule appointment and call patient to inform them. - Please contact requesting surgeon's office via preferred method (i.e, phone, fax) to inform them of need for appointment prior to surgery.  MarksvilleBhavinkumar Ronen Bromwell, GeorgiaPA  12/28/2017, 2:30 PM

## 2017-12-28 NOTE — Telephone Encounter (Signed)
LM TO CALL BACK NEEDS AN PRE OP APPT PER VIN BHAGAT PA .Zack Seal/CY

## 2017-12-29 NOTE — Telephone Encounter (Signed)
lmtcb

## 2018-01-01 NOTE — Telephone Encounter (Signed)
Left message for pt that he would need an appt before he could be cleared for his upcoming procedure.

## 2018-01-04 NOTE — Telephone Encounter (Signed)
L/m for patient to contact office to schedule follow up appointment.

## 2018-01-08 NOTE — Telephone Encounter (Signed)
S/w son, Aneta Minshillip, per [facility]) is aware pt needs visit for pre-op clearance scheduled for March 3.  Pt is to see Jacolyn ReedyMichele Lenze, PA, February 26 @ 2:00 pm.

## 2018-01-09 ENCOUNTER — Other Ambulatory Visit: Payer: Self-pay | Admitting: Cardiovascular Disease

## 2018-01-09 ENCOUNTER — Ambulatory Visit (INDEPENDENT_AMBULATORY_CARE_PROVIDER_SITE_OTHER): Payer: Medicare Other | Admitting: Physician Assistant

## 2018-01-09 ENCOUNTER — Encounter: Payer: Self-pay | Admitting: Physician Assistant

## 2018-01-09 VITALS — BP 108/72 | HR 58 | Ht 66.0 in | Wt 191.2 lb

## 2018-01-09 DIAGNOSIS — Z01818 Encounter for other preprocedural examination: Secondary | ICD-10-CM

## 2018-01-09 DIAGNOSIS — I481 Persistent atrial fibrillation: Secondary | ICD-10-CM | POA: Diagnosis not present

## 2018-01-09 DIAGNOSIS — I2510 Atherosclerotic heart disease of native coronary artery without angina pectoris: Secondary | ICD-10-CM | POA: Diagnosis not present

## 2018-01-09 DIAGNOSIS — E782 Mixed hyperlipidemia: Secondary | ICD-10-CM | POA: Diagnosis not present

## 2018-01-09 DIAGNOSIS — I712 Thoracic aortic aneurysm, without rupture: Secondary | ICD-10-CM | POA: Insufficient documentation

## 2018-01-09 DIAGNOSIS — I7121 Aneurysm of the ascending aorta, without rupture: Secondary | ICD-10-CM | POA: Insufficient documentation

## 2018-01-09 NOTE — Patient Instructions (Addendum)
Medication Instructions:  Your physician recommends that you continue on your current medications as directed. Please refer to the Current Medication list given to you today.  Labwork: Your physician recommends that you have lab work today- BMET   Testing/Procedures:  You are scheduled for CT at 11:00 am tomorrow. Cardiac CT scanning, (CAT scanning), is a noninvasive, special x-ray that produces cross-sectional images of the body using x-rays and a computer. CT scans help physicians diagnose and treat medical conditions. For some CT exams, a contrast material is used to enhance visibility in the area of the body being studied. CT scans provide greater clarity and reveal more details than regular x-ray exams.  Follow-Up: Your physician wants you to follow-up with your surgeon and your primary care doctor about holding your coumadin.   If you need a refill on your cardiac medications before your next appointment, please call your pharmacy.

## 2018-01-09 NOTE — Progress Notes (Signed)
Cardiology Office Note    Date:  01/09/2018   ID:  Ryan Tate, DOB 09/25/1930, MRN [id number]  PCP:  Kirby FunkGriffin, John, MD  Cardiologist: Lance MussJayadeep Varanasi, MD  No chief complaint on file.   History of Present Illness:  Ryan Tate is a 84 y.o. male with history of CAD status post BMS LAD & RCA in 2011, chronic atrial fibrillation on Coumadin managed by primary care, and 44 mm dilated asending aortic on CT in 2013.  Patient comes in today for surgical clearance for removal of several fractured maxillary implants and bone grafting to be done on Monday.  Patient still works full-time at [employer], does [employer] exercises every day and takes care of his garden and home.  He swims daily in the summer.  He denies any chest pain, palpitations, dyspnea, dyspnea on exertion, dizziness or presyncope.  He does not know whether or not he has to hold his Coumadin prior to surgery.  Past Medical History:  Diagnosis Date  . 427.31    afib  . Ascending aorta dilation (HCC)   . BPH (benign prostatic hyperplasia)    Dr. Retta Dionesahlstedt  . Coronary artery disease    2 BMS STENTS LAD AND RCA 2011  . Diverticulosis   . ED (erectile dysfunction)   . History of echocardiogram    2007 Normal LV size, mild LVH, aortic valve sclerosis, mild diastolic relaxation abn and mild aortic root dilation  . History of nuclear stress test    negative-done because of dyspnea  . Hyperlipidemia    borderline  . PVC's (premature ventricular contractions)   . Subclinical hypothyroidism   . Tinnitus    sees ENT    Past Surgical History:  Procedure Laterality Date  . CARDIAC CATHETERIZATION     bms stents LAD and RCA 2011  . cataracts in each eye    . dental implants      Current Medications: Current Meds  Medication Sig  . aspirin 81 MG tablet Take 81 mg by mouth daily.  . cholecalciferol (VITAMIN D) 1000 UNITS tablet Take 1,000 Units by mouth daily.  Marland Kitchen. levothyroxine (SYNTHROID, LEVOTHROID) 25 MCG  tablet Take 25 mcg by mouth daily before breakfast.   . lisinopril (PRINIVIL,ZESTRIL) 5 MG tablet Take 1 tablet (5 mg total) by mouth daily. Please keep 06/16/16 appointment for further refills  . metoprolol tartrate (LOPRESSOR) 25 MG tablet Take 1 tablet (25 mg total) by mouth 2 (two) times daily.  . nitroGLYCERIN (NITROSTAT) 0.4 MG SL tablet Place 1 tablet (0.4 mg total) under the tongue every 5 (five) minutes as needed for chest pain.  . simvastatin (ZOCOR) 40 MG tablet Take 1 tablet (40 mg total) by mouth at bedtime.  . vitamin B-12 (CYANOCOBALAMIN) 1000 MCG tablet Take 1,000 mcg by mouth daily.  Marland Kitchen. warfarin (COUMADIN) 5 MG tablet Take 5 mg by mouth as directed.      Allergies:   Patient has no known allergies.   Social History   Socioeconomic History  . Marital status: Married    Spouse name: None  . Number of children: None  . Years of education: None  . Highest education level: None  Social Needs  . Financial resource strain: None  . Food insecurity - worry: None  . Food insecurity - inability: None  . Transportation needs - medical: None  . Transportation needs - non-medical: None  Occupational History  . None  Tobacco Use  . Smoking status: Former Smoker  Years: 15.00    Last attempt to quit: 02/05/1972    Years since quitting: 45.9  . Smokeless tobacco: Never Used  Substance and Sexual Activity  . Alcohol use: No    Alcohol/week: 0.0 oz  . Drug use: None  . Sexual activity: None  Other Topics Concern  . None  Social History Narrative  . None     Family History:  The patient's family history includes Alcoholism in his father.   ROS:   Please see the history of present illness.    Review of Systems  Constitution: Negative.  HENT: Negative.   Cardiovascular: Negative.   Respiratory: Negative.   Endocrine: Negative.   Hematologic/Lymphatic: Negative.   Musculoskeletal: Negative.   Gastrointestinal: Negative.   Genitourinary: Negative.   Neurological:  Negative.    All other systems reviewed and are negative.   PHYSICAL EXAM:   VS:  BP 108/72   Pulse (!) 58   Ht 5\' 6"  (1.676 m)   Wt 191 lb 4 oz (86.8 kg)   SpO2 96%   BMI 30.87 kg/m   Physical Exam  GEN: Well nourished, well developed, in no acute distress  Neck: no JVD, carotid bruits, or masses Cardiac irregular irregular with 1/6 systolic murmur in the left sternal border Respiratory:  clear to auscultation bilaterally, normal work of breathing GI: soft, nontender, nondistended, + BS Ext: without cyanosis, clubbing, or edema, Good distal pulses bilaterally Neuro:  Alert and Oriented x 3 Psych: euthymic mood, full affect  Wt Readings from Last 3 Encounters:  01/09/18 191 lb 4 oz (86.8 kg)  02/15/17 184 lb (83.5 kg)  06/16/16 188 lb (85.3 kg)      Studies/Labs Reviewed:   EKG:  EKG is  ordered today.  The ekg ordered today demonstrates atrial fibrillation at 60 bpm poor R wave progression anteriorly, no acute change Recent Labs: No results found for requested labs within last 8760 hours.   Lipid Panel    Component Value Date/Time   CHOL 145 02/05/2015 0735   TRIG 81.0 02/05/2015 0735   HDL 45.30 02/05/2015 0735   CHOLHDL 3 02/05/2015 0735   VLDL 16.2 02/05/2015 0735   LDLCALC 84 02/05/2015 0735    Additional studies/ records that were reviewed today include:  CT with contrast 2013.  1. Stable slight fusiform prominence of the ascending aorta with maximum diameter of 44 mm. 2.  Cardiomegaly and coronary artery calcifications. 3.  No change in bronchiectatic changes within the right middle lobe as previously noted.   Original Report Authenticated By: Juline Patch, M.D.  2D echo 2012 mild MR, mild AI, mild aortic root dilatation and moderate asending aorta dilatation   ASSESSMENT:    1. Pre-op testing   2. Atherosclerosis of native coronary artery of native heart without angina pectoris   3. Persistent atrial fibrillation (HCC)   4. Mixed hyperlipidemia    5. Ascending aortic aneurysm (HCC)      PLAN:  In order of problems listed above:  Preoperative clearance patient to have removal of several fractured maxillary implants and bone grafting on Monday.  According to the revised cardiac risk index his perioperative risk of major cardiac event is 0.9 and does not warrant further cardiac workup.  He has a good functional capacity especially given his age of 84 years old.  He does have atrial fibrillation and is on Coumadin.  I have asked that he check and see how long this needs to be held prior to surgery  and to check with Dr. Valentina Lucks who manages his Coumadin and INRs.  It is fine with [place] if he holds his Coumadin for 4 days prior to the surgery.  He does have a history of an aortic aneurysm that has not been checked since 2013.  He is asymptomatic.  Discussed with Dr. Anne Fu.We will check a CT scan on Thursday.  According to the Revised Cardiac Risk Index (RCRI), his Perioperative Risk of Major Cardiac Event is (%): 0.9  His Functional Capacity in METs is: 5.07 according to the Duke Activity Status Index (DASI).  CAD status post BMS to the LAD and RCA in 2011.  No angina.  Continue aspirin  Chronic atrial fibrillation with controlled rate on metoprolol.  Also on Coumadin managed by primary care.  Check with him and surgeon concerning when to stop the Coumadin.  Mixed hyperlipidemia patient does not remember if he is taken Zocor or Lipitor.  Has regular follow-up with Dr. Eldridge Dace in April.  Of asked him to bring his medications with him.  Will need labs at that time.  Ascending aortic aneurysm 44 mm in 2013.  Asymptomatic.  Will check CT scan this Thursday to make sure it is not progressed.    Medication Adjustments/Labs and Tests Ordered: Current medicines are reviewed at length with the patient today.  Concerns regarding medicines are outlined above.  Medication changes, Labs and Tests ordered today are listed in the Patient Instructions  below. Patient Instructions  Medication Instructions:  Your physician recommends that you continue on your current medications as directed. Please refer to the Current Medication list given to you today.  Labwork: Your physician recommends that you have lab work today- BMET   Testing/Procedures:  You are scheduled for CT at 11:00 am tomorrow. Cardiac CT scanning, (CAT scanning), is a noninvasive, special x-ray that produces cross-sectional images of the body using x-rays and a computer. CT scans help physicians diagnose and treat medical conditions. For some CT exams, a contrast material is used to enhance visibility in the area of the body being studied. CT scans provide greater clarity and reveal more details than regular x-ray exams.  Follow-Up: Your physician wants you to follow-up with your surgeon and your primary care doctor about holding your coumadin.   If you need a refill on your cardiac medications before your next appointment, please call your pharmacy.       Signed, Jacolyn Reedy, PA-C  01/09/2018 2:27 PM    [facility] [address] [place], [place], [place]  [address] Phone: [phone]; Fax: [phone]

## 2018-01-10 ENCOUNTER — Other Ambulatory Visit: Payer: Medicare Other

## 2018-01-10 LAB — BASIC METABOLIC PANEL
BUN / CREAT RATIO: 15 (ref 10–24)
BUN: 20 mg/dL (ref 8–27)
CHLORIDE: 104 mmol/L (ref 96–106)
CO2: 25 mmol/L (ref 20–29)
Calcium: 8.6 mg/dL (ref 8.6–10.2)
Creatinine, Ser: 1.34 mg/dL — ABNORMAL HIGH (ref 0.76–1.27)
GFR calc Af Amer: 55 mL/min/{1.73_m2} — ABNORMAL LOW (ref >59)
GFR calc non Af Amer: 48 mL/min/{1.73_m2} — ABNORMAL LOW (ref >59)
GLUCOSE: 95 mg/dL (ref 65–99)
POTASSIUM: 4.4 mmol/L (ref 3.5–5.2)
SODIUM: 142 mmol/L (ref 134–144)

## 2018-01-11 ENCOUNTER — Ambulatory Visit (INDEPENDENT_AMBULATORY_CARE_PROVIDER_SITE_OTHER)
Admission: RE | Admit: 2018-01-11 | Discharge: 2018-01-11 | Disposition: A | Discharge status: Home / Self Care | Payer: Medicare Other | Source: Ambulatory Visit | Attending: Physician Assistant | Admitting: Physician Assistant

## 2018-01-11 DIAGNOSIS — Z01818 Encounter for other preprocedural examination: Secondary | ICD-10-CM

## 2018-01-11 MED ORDER — IOPAMIDOL (ISOVUE-370) INJECTION 76%
100.0000 mL | Freq: Once | INTRAVENOUS | Status: AC | PRN
  Administered 2018-01-11: 100 mL via INTRAVENOUS

## 2018-01-12 ENCOUNTER — Other Ambulatory Visit: Payer: Self-pay | Admitting: *Deleted

## 2018-01-12 DIAGNOSIS — I712 Thoracic aortic aneurysm, without rupture, unspecified: Secondary | ICD-10-CM

## 2018-01-12 NOTE — Progress Notes (Signed)
am

## 2018-01-15 ENCOUNTER — Other Ambulatory Visit: Payer: Self-pay | Admitting: *Deleted

## 2018-01-15 DIAGNOSIS — I712 Thoracic aortic aneurysm, without rupture, unspecified: Secondary | ICD-10-CM

## 2018-01-15 NOTE — Progress Notes (Signed)
amb  

## 2018-02-06 ENCOUNTER — Emergency Department (HOSPITAL_COMMUNITY)
Admission: EM | Admit: 2018-02-06 | Discharge: 2018-02-06 | Disposition: A | Discharge status: Home / Self Care | Payer: Medicare Other | Attending: Emergency Medicine | Admitting: Emergency Medicine

## 2018-02-06 ENCOUNTER — Other Ambulatory Visit: Payer: Self-pay

## 2018-02-06 ENCOUNTER — Encounter (HOSPITAL_COMMUNITY): Payer: Self-pay

## 2018-02-06 ENCOUNTER — Emergency Department (HOSPITAL_COMMUNITY): Payer: Medicare Other

## 2018-02-06 DIAGNOSIS — N179 Acute kidney failure, unspecified: Secondary | ICD-10-CM | POA: Diagnosis not present

## 2018-02-06 DIAGNOSIS — Z87891 Personal history of nicotine dependence: Secondary | ICD-10-CM | POA: Diagnosis not present

## 2018-02-06 DIAGNOSIS — R42 Dizziness and giddiness: Secondary | ICD-10-CM | POA: Insufficient documentation

## 2018-02-06 DIAGNOSIS — I2510 Atherosclerotic heart disease of native coronary artery without angina pectoris: Secondary | ICD-10-CM | POA: Diagnosis not present

## 2018-02-06 DIAGNOSIS — Z79899 Other long term (current) drug therapy: Secondary | ICD-10-CM | POA: Insufficient documentation

## 2018-02-06 DIAGNOSIS — Z7982 Long term (current) use of aspirin: Secondary | ICD-10-CM | POA: Diagnosis not present

## 2018-02-06 DIAGNOSIS — Z7901 Long term (current) use of anticoagulants: Secondary | ICD-10-CM | POA: Diagnosis not present

## 2018-02-06 LAB — CBC
HCT: 42.2 % (ref 39.0–52.0)
Hemoglobin: 13.8 g/dL (ref 13.0–17.0)
MCH: 28.5 pg (ref 26.0–34.0)
MCHC: 32.7 g/dL (ref 30.0–36.0)
MCV: 87.2 fL (ref 78.0–100.0)
PLATELETS: 152 10*3/uL (ref 150–400)
RBC: 4.84 MIL/uL (ref 4.22–5.81)
RDW: 15.2 % (ref 11.5–15.5)
WBC: 6.5 10*3/uL (ref 4.0–10.5)

## 2018-02-06 LAB — I-STAT TROPONIN, ED: TROPONIN I, POC: 0.01 ng/mL (ref 0.00–0.08)

## 2018-02-06 LAB — BASIC METABOLIC PANEL
Anion gap: 11 (ref 5–15)
BUN: 17 mg/dL (ref 6–20)
CALCIUM: 8.7 mg/dL — AB (ref 8.9–10.3)
CHLORIDE: 100 mmol/L — AB (ref 101–111)
CO2: 25 mmol/L (ref 22–32)
CREATININE: 1.47 mg/dL — AB (ref 0.61–1.24)
GFR calc non Af Amer: 41 mL/min — ABNORMAL LOW (ref >60)
GFR, EST AFRICAN AMERICAN: 48 mL/min — AB (ref >60)
Glucose, Bld: 185 mg/dL — ABNORMAL HIGH (ref 65–99)
Potassium: 3.9 mmol/L (ref 3.5–5.1)
SODIUM: 136 mmol/L (ref 135–145)

## 2018-02-06 LAB — CBG MONITORING, ED: GLUCOSE-CAPILLARY: 160 mg/dL — AB (ref 65–99)

## 2018-02-06 MED ORDER — MECLIZINE HCL 25 MG PO TABS: 25.0000 mg | ORAL_TABLET | Freq: Three times a day (TID) | ORAL | 0 refills | Status: AC | PRN

## 2018-02-06 MED ORDER — LACTATED RINGERS IV BOLUS
1000.0000 mL | Freq: Once | INTRAVENOUS | Status: AC
  Administered 2018-02-06: 1000 mL via INTRAVENOUS

## 2018-02-06 NOTE — Discharge Instructions (Addendum)
DRINK PLENTY OF FLUIDS. STOP YOUR LISINOPRIL FOR NOW AND CALL YOUR PRIMARY CARE DOCTOR IN THE MORNING TO SCHEDULE FOLLOW UP FOR RECHECK OF YOUR KIDNEY FUNCTION AND TO DISCUSS THIS MEDICATION. RETURN TO ER IF YOU HAVE ANY HEADACHES, FOCAL WEAKNESS/NUMBNESS, VISION CHANGES, CHEST PAIN, OR BREATHING PROBLEMS.

## 2018-02-06 NOTE — ED Triage Notes (Signed)
Pt coming from home with chest pain that began around 1100. Pt also reports dizziness and shortness of breath. Skin warm and dry. No distress noted at this time.

## 2018-02-06 NOTE — ED Provider Notes (Signed)
[facility] EMERGENCY DEPARTMENT Provider Note   CSN: [id number] Arrival date & time: 02/06/18  1251     History   Chief Complaint Chief Complaint  Patient presents with  . Dizziness    HPI Ryan Tate is a 84 y.o. male.  84yo M w/ PMH including CAD s/p stenting, A fib on coumadin, HLD who p/w dizziness.  Triage note stated that the patient had chest pain episode this morning but he vehemently denies any chest pain.  He reports that this morning while sitting he had a sudden onset of dizziness that he describes as room spinning sensation.  Triage note states shortness of breath but the patient denies any shortness of breath associated with the episode or recent change in shortness of breath with exertion.  He told me that his episode lasted 10-15 seconds then resolved and recurred again later.  Daughter states that when she went to his house, he was sitting with his eyes closed, quiet and not moving and it seemed that the dizziness lasted much longer until he got to the ER.  He currently denies any complaints.  He has had no vision changes, extremity numbness/weakness, headache, nausea/vomiting, fevers, or recent illness.  No recent changes to his medicines.  He states that he has no history of episodes like this but then later states that he has medicine at home for dizziness.  Daughter is not sure. He has chronic tinnitus with no change recently.   The history is provided by the patient and a relative.  Dizziness    Past Medical History:  Diagnosis Date  . 427.31    afib  . Ascending aorta dilation (HCC)   . BPH (benign prostatic hyperplasia)    Dr. Retta Diones  . Coronary artery disease    2 BMS STENTS LAD AND RCA 2011  . Diverticulosis   . ED (erectile dysfunction)   . History of echocardiogram    2007 Normal LV size, mild LVH, aortic valve sclerosis, mild diastolic relaxation abn and mild aortic root dilation  . History of nuclear stress test    negative-done because of dyspnea  . Hyperlipidemia    borderline  . PVC's (premature ventricular contractions)   . Subclinical hypothyroidism   . Tinnitus    sees ENT    Patient Active Problem List   Diagnosis Date Noted  . Ascending aortic aneurysm (HCC) 01/09/2018  . Coronary artery disease   . PVC's (premature ventricular contractions)   . Tinnitus   . ED (erectile dysfunction)   . Subclinical hypothyroidism   . Atrial fibrillation (HCC) 02/04/2014  . Coronary atherosclerosis of native coronary artery 02/04/2014  . Mixed hyperlipidemia 02/04/2014  . BPH (benign prostatic hyperplasia)     Past Surgical History:  Procedure Laterality Date  . CARDIAC CATHETERIZATION     bms stents LAD and RCA 2011  . cataracts in each eye    . dental implants          Home Medications    Prior to Admission medications   Medication Sig Start Date End Date Taking? Authorizing Provider  aspirin 81 MG tablet Take 81 mg by mouth daily.   Yes [provider]  atorvastatin (LIPITOR) 40 MG tablet Take 40 mg by mouth at bedtime.  01/13/18  Yes [provider]  CALCIUM PO Take 1 tablet by mouth daily.   Yes [provider]  cholecalciferol (VITAMIN D) 1000 UNITS tablet Take 1,000 Units by mouth daily.   Yes  [provider]  levothyroxine (SYNTHROID, LEVOTHROID) 25 MCG tablet Take 25 mcg by mouth daily before breakfast.  01/23/14  Yes [provider]  lisinopril (PRINIVIL,ZESTRIL) 5 MG tablet Take 1 tablet (5 mg total) by mouth daily. Please keep 06/16/16 appointment for further refills Patient taking differently: Take 5 mg by mouth daily.  02/15/17  Yes Corky CraftsVaranasi, Jayadeep S, MD  metoprolol tartrate (LOPRESSOR) 25 MG tablet Take 1 tablet (25 mg total) by mouth 2 (two) times daily. 02/15/17  Yes Corky CraftsVaranasi, Jayadeep S, MD  vitamin B-12 (CYANOCOBALAMIN) 1000 MCG tablet Take 1,000 mcg by mouth daily.   Yes [provider]  warfarin (COUMADIN) 5 MG tablet  Take 2.5-5 mg by mouth See admin instructions. Take 2.5 mg by mouth at 1 PM daily on Sun/Mon/Wed/Thurs/Sat and 5 mg on Tues/Fri 02/01/14  Yes [provider]  meclizine (ANTIVERT) 25 MG tablet Take 1 tablet (25 mg total) by mouth 3 (three) times daily as needed for dizziness. 02/06/18   Lukka Black, Ambrose Finlandachel Morgan, MD  nitroGLYCERIN (NITROSTAT) 0.4 MG SL tablet Place 1 tablet (0.4 mg total) under the tongue every 5 (five) minutes as needed for chest pain. Patient not taking: Reported on 02/06/2018 02/15/17   Corky CraftsVaranasi, Jayadeep S, MD  simvastatin (ZOCOR) 40 MG tablet Take 1 tablet (40 mg total) by mouth at bedtime. Patient not taking: Reported on 02/06/2018 05/29/17 02/06/18  Corky CraftsVaranasi, Jayadeep S, MD    Family History Family History  Problem Relation Age of Onset  . Alcoholism Father   . Heart attack Neg Hx     Social History Social History   Tobacco Use  . Smoking status: Former Smoker    Years: 15.00    Last attempt to quit: 02/05/1972    Years since quitting: 46.0  . Smokeless tobacco: Never Used  Substance Use Topics  . Alcohol use: No    Alcohol/week: 0.0 oz  . Drug use: Not on file     Allergies   Patient has no known allergies.   Review of Systems Review of Systems  Neurological: Positive for dizziness.   All other systems reviewed and are negative except that which was mentioned in HPI   Physical Exam Updated Vital Signs BP 132/78   Pulse 93   Temp 97.7 F (36.5 C) (Oral)   Resp 18   SpO2 99%   Physical Exam  Constitutional: He is oriented to person, place, and time. He appears well-developed and well-nourished. No distress.  HENT:  Head: Normocephalic and atraumatic.  Moist mucous membranes  Eyes: Pupils are equal, round, and reactive to light. Conjunctivae are normal.  Neck: Neck supple.  Cardiovascular: Normal rate and normal heart sounds. An irregularly irregular rhythm present.  No murmur heard. Pulmonary/Chest: Effort normal and breath sounds normal.   Abdominal: Soft. Bowel sounds are normal. He exhibits no distension. There is no tenderness.  Musculoskeletal: He exhibits no edema.  Neurological: He is alert and oriented to person, place, and time. He displays normal reflexes. No cranial nerve deficit or sensory deficit. He exhibits normal muscle tone.  Fluent speech, no clonus, 5/5 strength x all 4 ext  Skin: Skin is warm and dry.  Psychiatric: He has a normal mood and affect. Judgment normal.  Nursing note and vitals reviewed.    ED Treatments / Results  Labs (all labs ordered are listed, but only abnormal results are displayed) Labs Reviewed  BASIC METABOLIC PANEL - Abnormal; Notable for the following components:      Result Value  Chloride 100 (*)    Glucose, Bld 185 (*)    Creatinine, Ser 1.47 (*)    Calcium 8.7 (*)    GFR calc non Af Amer 41 (*)    GFR calc Af Amer 48 (*)    All other components within normal limits  CBG MONITORING, ED - Abnormal; Notable for the following components:   Glucose-Capillary 160 (*)    All other components within normal limits  CBC  I-STAT TROPONIN, ED    EKG EKG Interpretation  Date/Time:  Tuesday February 06 2018 12:53:34 EDT Ventricular Rate:  94 PR Interval:    QRS Duration: 70 QT Interval:  384 QTC Calculation: 480 R Axis:   -22 Text Interpretation:  Atrial fibrillation with premature ventricular or aberrantly conducted complexes Low voltage QRS Possible Inferior infarct , age undetermined Cannot rule out Anterior infarct , age undetermined Abnormal ECG low voltages compared to previous Confirmed by Frederick Peers [id number]) on 02/06/2018 4:54:26 PM   Radiology Dg Chest 2 View  Result Date: 02/06/2018 CLINICAL DATA:  84 year old male with dizziness earlier today. Nonsmoker. Initial encounter. EXAM: CHEST - 2 VIEW COMPARISON:  01/11/2018 chest CT.  04/16/2009 chest x-ray. FINDINGS: Heart size top-normal. Calcified mildly tortuous aorta. Central pulmonary vascular prominence without  pulmonary edema. Chronic lung changes without segmental infiltrate. Opacity projecting over the mid to lower thoracic spine felt to represent confluence of structures. No acute osseous abnormality. Bilateral shoulder joint degenerative changes greater on the right. IMPRESSION: Top-normal heart size. Chronic lung changes without acute infiltrate or pulmonary edema. Aortic Atherosclerosis (ICD10-I70.0). Electronically Signed   By: Lacy Duverney M.D.   On: 02/06/2018 13:43    Procedures Procedures (including critical care time)  Medications Ordered in ED Medications  lactated ringers bolus 1,000 mL (0 mLs Intravenous Stopped 02/06/18 1904)   Orthostatic VS for the past 24 hrs:  BP- Lying Pulse- Lying BP- Sitting Pulse- Sitting BP- Standing at 0 minutes Pulse- Standing at 0 minutes  02/06/18 1813 121/71 93 119/74 103 123/75 105       Initial Impression / Assessment and Plan / ED Course  I have reviewed the triage vital signs and the nursing notes.  Pertinent labs & imaging results that were available during my care of the patient were reviewed by me and considered in my medical decision making (see chart for details).    He was well-appearing on exam with reassuring vital signs.  Atrial fibrillation on the monitor, EKG reassuring.  He had a normal neurologic exam and denied any complaints.  He has had no headache or neurologic findings to suggest intracranial cause of vertigo.  For me he denies any chest pain or shortness of breath whatsoever and with reassuring EKG and troponin I doubt ACS or life-threatening cardiac process as the cause of his symptoms.  His creatinine today is 1.47 which is slightly elevated from 1.3 recently.  The remainder of his lab work including CBC is normal.  I gave a liter of fluids and discussed that his mild AK I would need to be followed closely with his outpatient provider to ensure that his creatinine does not continue to climb.  He is on a very low-dose of  lisinopril and his blood pressure is normal here therefore I recommended stopping lisinopril for now until he speaks with his primary provider to have his creatinine rechecked.  I will provide with meclizine to use as needed for symptoms at home but given that he is asymptomatic here I do not  feel he needs any further workup at this time.  Have extensively reviewed return precautions with the patient his daughter.  Final Clinical Impressions(s) / ED Diagnoses   Final diagnoses:  Dizziness  AKI (acute kidney injury) [facility])    ED Discharge Orders        Ordered    meclizine (ANTIVERT) 25 MG tablet  3 times daily PRN     02/06/18 2008       Gonsalo Cuthbertson, Ambrose Finland, MD 02/06/18 2125

## 2018-02-06 NOTE — ED Notes (Signed)
Pt getting changed into gown at this time.

## 2018-02-06 NOTE — ED Notes (Signed)
Pt verbalized understanding of all d/c instructions, prescriptions, and f/u information. VSS. All belongings with patient at this time.  Pt ambulatory to lobby with steady gait.  

## 2018-02-06 NOTE — ED Triage Notes (Signed)
Pt stating he wants water and is very thirsty. Provided with a cup of water. No difficulty swallowing. Pt denies chest pain or dizziness. CBG 160.

## 2018-02-06 NOTE — ED Notes (Signed)
ED Provider at bedside. 

## 2018-02-06 NOTE — ED Notes (Addendum)
Pt denies that he had any CP this AM. Per patient's daughter, patient's daughter patient has not complained of any CP today but was dizzy and SOB this AM. Pt's daughter said "I think he mentioned something about CP to my brother but he has denied any with me." Pt adamantly denies any CP.

## 2018-02-06 NOTE — ED Notes (Addendum)
Pt ambulatory to restroom with steady gait, denies dizziness at this time. EDP aware pt fluids are finished.

## 2018-02-14 ENCOUNTER — Encounter: Payer: Medicare Other | Admitting: Surgery

## 2018-02-14 NOTE — Progress Notes (Signed)
Cardiology Office Note   Date:  02/15/2018   ID:  [name], DOB Apr 05, 1930, MRN [id number]  PCP:  Ryan Funk, MD    No chief complaint on file. CAD   Wt Readings from Last 3 Encounters:  02/15/18 182 lb (82.6 kg)  01/09/18 191 lb 4 oz (86.8 kg)  02/15/17 184 lb (83.5 kg)       History of Present Illness: Ryan Tate is a 84 y.o. male  with history of CAD status post BMS LAD & RCA in 2011, chronic atrial fibrillation on Coumadin managed by primary care, and 44 mm dilated asending aortic on CT in 2013.  In 3/19, he had removal of several fractured maxillary implants and bone grafting.  He had an episode of dehydration that has resolved.  He tries to stay better hydrated.  Since the ER visit, he Denies : Chest pain. Dizziness. Leg edema. Nitroglycerin use. Orthopnea. Palpitations. Paroxysmal nocturnal dyspnea. Shortness of breath. Syncope.   He continues to work.  He does some "military exercises" twice a day.  No problems with this activity.     Past Medical History:  Diagnosis Date  . 427.31    afib  . Ascending aorta dilation (HCC)   . BPH (benign prostatic hyperplasia)    Dr. Retta Tate  . Coronary artery disease    2 BMS STENTS LAD AND RCA 2011  . Diverticulosis   . ED (erectile dysfunction)   . History of echocardiogram    2007 Normal LV size, mild LVH, aortic valve sclerosis, mild diastolic relaxation abn and mild aortic root dilation  . History of nuclear stress test    negative-done because of dyspnea  . Hyperlipidemia    borderline  . PVC's (premature ventricular contractions)   . Subclinical hypothyroidism   . Tinnitus    sees ENT    Past Surgical History:  Procedure Laterality Date  . CARDIAC CATHETERIZATION     bms stents LAD and RCA 2011  . cataracts in each eye    . dental implants       Current Outpatient Medications  Medication Sig Dispense Refill  . aspirin 81 MG tablet Take 81 mg by mouth daily.    Marland Kitchen atorvastatin  (LIPITOR) 40 MG tablet Take 40 mg by mouth at bedtime.     Marland Kitchen CALCIUM PO Take 1 tablet by mouth daily.    . cholecalciferol (VITAMIN D) 1000 UNITS tablet Take 1,000 Units by mouth daily.    . meclizine (ANTIVERT) 25 MG tablet Take 1 tablet (25 mg total) by mouth 3 (three) times daily as needed for dizziness. 10 tablet 0  . metoprolol tartrate (LOPRESSOR) 25 MG tablet Take 1 tablet (25 mg total) by mouth 2 (two) times daily. 180 tablet 3  . nitroGLYCERIN (NITROSTAT) 0.4 MG SL tablet Place 1 tablet (0.4 mg total) under the tongue every 5 (five) minutes as needed for chest pain. 30 tablet 1  . vitamin B-12 (CYANOCOBALAMIN) 1000 MCG tablet Take 1,000 mcg by mouth daily.    Marland Kitchen warfarin (COUMADIN) 5 MG tablet Take 2.5-5 mg by mouth See admin instructions. Take 2.5 mg by mouth at 1 PM daily on Sun/Mon/Wed/Thurs/Sat and 5 mg on Tues/Fri     No current facility-administered medications for this visit.     Allergies:   Patient has no known allergies.    Social History:  The patient  reports that he quit smoking about 46 years ago. He quit after 15.00 years of use.  He has never used smokeless tobacco. He reports that he does not drink alcohol.   Family History:  The patient's family history includes Alcoholism in his father.    ROS:  Please see the history of present illness.   Otherwise, review of systems are positive for recent dental work.   All other systems are reviewed and negative.    PHYSICAL EXAM: VS:  BP 138/64   Pulse 86   Ht 5\' 6"  (1.676 m)   Wt 182 lb (82.6 kg)   SpO2 96%   BMI 29.38 kg/m  , BMI Body mass index is 29.38 kg/m. GEN: Well nourished, well developed, in no acute distress  HEENT: normal  Neck: no JVD, carotid bruits, or masses Cardiac: irregularly irregular; no murmurs, rubs, or gallops,no edema  Respiratory:  clear to auscultation bilaterally, normal work of breathing GI: soft, nontender, nondistended, + BS MS: no deformity or atrophy  Skin: warm and dry, no  rash Neuro:  Strength and sensation are intact Psych: euthymic mood, full affect   EKG:   The ekg ordered 3/27 demonstrates AFib, borderline rate control   Recent Labs: 02/06/2018: BUN 17; Creatinine, Ser 1.47; Hemoglobin 13.8; Platelets 152; Potassium 3.9; Sodium 136   Lipid Panel    Component Value Date/Time   CHOL 145 02/05/2015 0735   TRIG 81.0 02/05/2015 0735   HDL 45.30 02/05/2015 0735   CHOLHDL 3 02/05/2015 0735   VLDL 16.2 02/05/2015 0735   LDLCALC 84 02/05/2015 0735     Other studies Reviewed: Additional studies/ records that were reviewed today with results demonstrating: Most recent Cr 1.3.   ASSESSMENT AND PLAN:  1. CAD: No angina on medial therapy.  Continue aggressive secondary prevention.  Refill nitroglycerin. 2. AFib: Rate controlled.  Coumadin for stroke prevention.  INR followed by Dr. Valentina Tate. 3. Aortic aneurysm: 45 mm by CT in 2/19.  There is no significant change from 2013.  He does have an appointment with Dr. Laneta Tate that was previously arranged.  We went over the type of pain that he may have if he were to have an aortic dissection.  We talked about the importance of blood pressure control. 4. Anticoagulated: No bleeding problems. 5. Hyperlipidemia: LDL 93 in September 2018. 6. Bradycardia: No issues recently with bradycardia.  I stressed the importance of staying well-hydrated.  I suspect that with his recent dental work, he may not been eating as well and got dehydrated.   Current medicines are reviewed at length with the patient today.  The patient concerns regarding his medicines were addressed.  The following changes have been made:  No change  Labs/ tests ordered today include:  No orders of the defined types were placed in this encounter.   Recommend 150 minutes/week of aerobic exercise Low fat, low carb, high fiber diet recommended  Disposition:   FU in 1 year   Signed, Ryan MussJayadeep Alontae Chaloux, MD  02/15/2018 4:21 PM    [facility] [address], [place], [place]  [address] Phone: [phone]; Fax: [phone]

## 2018-02-15 ENCOUNTER — Ambulatory Visit: Payer: Medicare Other | Admitting: Interventional Cardiology

## 2018-02-15 ENCOUNTER — Encounter: Payer: Self-pay | Admitting: Interventional Cardiology

## 2018-02-15 ENCOUNTER — Encounter (INDEPENDENT_AMBULATORY_CARE_PROVIDER_SITE_OTHER): Payer: Self-pay

## 2018-02-15 VITALS — BP 138/64 | HR 86 | Ht 66.0 in | Wt 182.0 lb

## 2018-02-15 DIAGNOSIS — I4819 Other persistent atrial fibrillation: Secondary | ICD-10-CM

## 2018-02-15 DIAGNOSIS — I481 Persistent atrial fibrillation: Secondary | ICD-10-CM | POA: Diagnosis not present

## 2018-02-15 DIAGNOSIS — I2510 Atherosclerotic heart disease of native coronary artery without angina pectoris: Secondary | ICD-10-CM | POA: Diagnosis not present

## 2018-02-15 DIAGNOSIS — E782 Mixed hyperlipidemia: Secondary | ICD-10-CM | POA: Diagnosis not present

## 2018-02-15 DIAGNOSIS — R001 Bradycardia, unspecified: Secondary | ICD-10-CM | POA: Diagnosis not present

## 2018-02-15 DIAGNOSIS — Z7901 Long term (current) use of anticoagulants: Secondary | ICD-10-CM

## 2018-02-15 DIAGNOSIS — I712 Thoracic aortic aneurysm, without rupture: Secondary | ICD-10-CM | POA: Diagnosis not present

## 2018-02-15 DIAGNOSIS — I7121 Aneurysm of the ascending aorta, without rupture: Secondary | ICD-10-CM

## 2018-02-15 MED ORDER — NITROGLYCERIN 0.4 MG SL SUBL: 0.4000 mg | SUBLINGUAL_TABLET | SUBLINGUAL | 1 refills | Status: AC | PRN

## 2018-02-15 NOTE — Patient Instructions (Signed)

## 2018-02-21 ENCOUNTER — Institutional Professional Consult (permissible substitution): Payer: Medicare Other | Admitting: Surgery

## 2018-02-21 ENCOUNTER — Encounter: Payer: Self-pay | Admitting: Surgery

## 2018-02-21 VITALS — BP 138/72 | HR 76 | Resp 20 | Ht 66.0 in | Wt 185.0 lb

## 2018-02-21 DIAGNOSIS — I712 Thoracic aortic aneurysm, without rupture: Secondary | ICD-10-CM

## 2018-02-21 DIAGNOSIS — I7121 Aneurysm of the ascending aorta, without rupture: Secondary | ICD-10-CM

## 2018-02-21 NOTE — Progress Notes (Signed)
Cardiothoracic Surgery Consultation  PCP is Kirby Funk, MD Referring Provider is Dyann Kief, PA-C  Chief Complaint  Patient presents with  . Thoracic Aortic Aneurysm    Surgical eval, CTA Chest  01/11/18    HPI:  The patient is an 84 year old gentleman with a history of hyperlipidemia, chronic atrial fibrillation on Coumadin, and coronary artery disease status post bare metal stenting of his LAD and RCA in 2011 who also has a known ascending aortic aneurysm measured at 44 mm in 2013.  He had a follow-up CTA of the chest on 01/11/2018 which showed minimal increase in the size of the ascending aorta at 45 mm.  He continues to feel well without any chest or back pain.  He denies any shortness of breath.  He stays fairly active and exercises daily. Past Medical History:  Diagnosis Date  . 427.31    afib  . Ascending aorta dilation (HCC)   . BPH (benign prostatic hyperplasia)    Dr. Retta Diones  . Coronary artery disease    2 BMS STENTS LAD AND RCA 2011  . Diverticulosis   . ED (erectile dysfunction)   . History of echocardiogram    2007 Normal LV size, mild LVH, aortic valve sclerosis, mild diastolic relaxation abn and mild aortic root dilation  . History of nuclear stress test    negative-done because of dyspnea  . Hyperlipidemia    borderline  . PVC's (premature ventricular contractions)   . Subclinical hypothyroidism   . Tinnitus    sees ENT    Past Surgical History:  Procedure Laterality Date  . CARDIAC CATHETERIZATION     bms stents LAD and RCA 2011  . cataracts in each eye    . dental implants      Family History  Problem Relation Age of Onset  . Alcoholism Father   . Heart attack Neg Hx     Social History Social History   Tobacco Use  . Smoking status: Former Smoker    Years: 15.00    Last attempt to quit: 02/05/1972    Years since quitting: 46.0  . Smokeless tobacco: Never Used  Substance Use Topics  . Alcohol use: No    Alcohol/week: 0.0 oz   . Drug use: Not on file    Current Outpatient Medications  Medication Sig Dispense Refill  . aspirin 81 MG tablet Take 81 mg by mouth daily.    Marland Kitchen atorvastatin (LIPITOR) 40 MG tablet Take 40 mg by mouth at bedtime.     Marland Kitchen CALCIUM PO Take 1 tablet by mouth daily.    . cholecalciferol (VITAMIN D) 1000 UNITS tablet Take 1,000 Units by mouth daily.    . metoprolol tartrate (LOPRESSOR) 25 MG tablet Take 1 tablet (25 mg total) by mouth 2 (two) times daily. 180 tablet 3  . nitroGLYCERIN (NITROSTAT) 0.4 MG SL tablet Place 1 tablet (0.4 mg total) under the tongue every 5 (five) minutes as needed for chest pain. 30 tablet 1  . vitamin B-12 (CYANOCOBALAMIN) 1000 MCG tablet Take 1,000 mcg by mouth daily.    Marland Kitchen warfarin (COUMADIN) 5 MG tablet Take 2.5-5 mg by mouth See admin instructions. Take 2.5 mg by mouth at 1 PM daily on Sun/Mon/Wed/Thurs/Sat and 5 mg on Tues/Fri    . meclizine (ANTIVERT) 25 MG tablet Take 1 tablet (25 mg total) by mouth 3 (three) times daily as needed for dizziness. (Patient not taking: Reported on 02/21/2018) 10 tablet 0   No current facility-administered  medications for this visit.     No Known Allergies  Review of Systems  Constitutional: Negative for activity change and fatigue.  HENT: Negative.   Eyes: Negative.   Respiratory: Negative for chest tightness and shortness of breath.   Cardiovascular: Negative for chest pain and leg swelling.  Gastrointestinal: Negative.   Endocrine: Negative.   Genitourinary: Negative.   Musculoskeletal: Negative.   Allergic/Immunologic: Negative.   Neurological: Positive for dizziness.  Hematological: Negative.   Psychiatric/Behavioral: Negative.     BP 138/72   Pulse 76   Resp 20   Ht 5\' 6"  (1.676 m)   Wt 185 lb (83.9 kg)   SpO2 98% Comment: RA  BMI 29.86 kg/m  Physical Exam  Constitutional: He is oriented to person, place, and time. He appears well-developed and well-nourished. No distress.  HENT:  Head: Normocephalic and  atraumatic.  Mouth/Throat: Oropharynx is clear and moist.  Eyes: Pupils are equal, round, and reactive to light. EOM are normal.  Neck: Normal range of motion. Neck supple. No JVD present. No thyromegaly present.  Cardiovascular: Normal rate, regular rhythm and normal heart sounds.  No murmur heard. Pulmonary/Chest: Effort normal and breath sounds normal.  Abdominal: Soft. Bowel sounds are normal. He exhibits no distension. There is no tenderness.  Musculoskeletal: Normal range of motion. He exhibits no edema.  Lymphadenopathy:    He has no cervical adenopathy.  Neurological: He is alert and oriented to person, place, and time.  Skin: Skin is warm and dry.  Psychiatric: He has a normal mood and affect.    Diagnostic Tests:  CLINICAL DATA:  84 year old male under preoperative evaluation prior to potential aortic surgery.  EXAM: CT ANGIOGRAPHY CHEST WITH CONTRAST  TECHNIQUE: Multidetector CT imaging of the chest was performed using the standard protocol during bolus administration of intravenous contrast. Multiplanar CT image reconstructions and MIPs were obtained to evaluate the vascular anatomy.  CONTRAST:  100mL ISOVUE-370 IOPAMIDOL (ISOVUE-370) INJECTION 76%  COMPARISON:  Chest CT 12/26/2011.  FINDINGS: Cardiovascular: Heart size is borderline enlarged. There is no significant pericardial fluid, thickening or pericardial calcification. There is aortic atherosclerosis, as well as atherosclerosis of the great vessels of the mediastinum and the coronary arteries, including calcified atherosclerotic plaque in the left anterior descending and right coronary arteries. Mild aneurysmal dilatation of the ascending thoracic aorta which measures up to 4.5 cm in diameter (minimally increased from 4.4 cm on prior study 12/26/2011). No thoracic aortic dissection. Separate origin of the left vertebral artery directly off the aortic arch (normal anatomical variant) incidentally  noted.  Mediastinum/Nodes: No pathologically enlarged mediastinal or hilar lymph nodes. Esophagus is unremarkable in appearance. No axillary lymphadenopathy.  Lungs/Pleura: Focal area of cylindrical and varicose bronchiectasis with volume loss and architectural distortion in the lateral segment of the right middle lobe, similar to the prior study, presumably chronic post infectious or inflammatory scarring. Some very mild cylindrical bronchiectasis is also noted in the left lower lobe. No acute consolidative airspace disease. No pleural effusions. No suspicious appearing pulmonary nodules or masses are noted. Focal nodular pleural-based area of architectural distortion in the posterior aspect of the right lower lobe (axial image 65 of series 5) is unchanged, definitively benign.  Upper Abdomen: Small calcified granuloma in the liver. Aortic atherosclerosis. Incompletely imaged parapelvic low-attenuation lesion in the left kidney measuring at least 3.8 cm, incompletely characterized but likely a peripelvic cyst.  Musculoskeletal: Flowing anterior osteophytes are again noted throughout the thoracic spine, compatible with diffuse idiopathic skeletal hyperostosis. Compared to  the prior study there is a new (but non acute appearing) fracture through the anterior osteophytes at the level of T7 which extends into the anterior and inferior aspect of the T7 vertebral body. No associated loss of height. Chronic compression fracture of T3 with approximately 20% loss of anterior vertebral body height. There are no aggressive appearing lytic or blastic lesions noted in the visualized portions of the skeleton.  Review of the MIP images confirms the above findings.  IMPRESSION: 1. Mild aneurysmal dilatation of the ascending thoracic aorta (4.5 cm in diameter), minimally increased compared to 4.4 cm on 12/26/2011. Ascending thoracic aortic aneurysm. Recommend semi-annual imaging followup  by CTA or MRA and referral to cardiothoracic surgery if not already obtained. This recommendation follows 2010 ACCF/AHA/AATS/ACR/ASA/SCA/SCAI/SIR/STS/SVM Guidelines for the Diagnosis and Management of Patients With Thoracic Aortic Disease. Circulation. 2010; 121: R604-V409. 2. Aortic atherosclerosis, in addition to 2 vessel coronary artery disease. 3. Additional incidental findings, as above.  Aortic Atherosclerosis (ICD10-I70.0). Aortic aneurysm NOS (ICD10-I71.9).   Electronically Signed   By: Trudie Reed M.D.   On: 01/11/2018 14:45  Impression:  This 84 year old gentleman has a 4.5 cm fusiform ascending aortic aneurysm which has increased minimally from 2013 when it was noted to be 4.4 cm.  This is still well below the 5.5 cm surgical threshold.  He had an echocardiogram in 2012 which showed some enlargement of his a sending aorta with mild aortic insufficiency but there is no comment as to whether he had a trileaflet aortic valve or a bicuspid aortic valve.  This was not done in the hospital systems I have no way of reviewing that.  His blood pressure is under good control and he is on a beta-blocker.  I reviewed the CTA images with the patient and his family and answered their questions.  Plan:  I will plan to see him back in 1 year with a CT scan of the chest without contrast to reassess the ascending aortic aneurysm.  I think it is worth following this even though he is 84 years old because he is in excellent physical condition and would be a surgical candidate at this time if it did enlarge.  Since there is been very little change since 2013 I think chances are that this will remain very stable.  I spent 45 minutes performing this consultation and > 50% of this time was spent face to face counseling and coordinating the care of this patient's ascending aortic aneurysm.   Alleen Borne, MD [facility] [phone]

## 2018-03-18 ENCOUNTER — Other Ambulatory Visit: Payer: Self-pay | Admitting: Interventional Cardiology

## 2018-04-30 ENCOUNTER — Other Ambulatory Visit: Payer: Self-pay | Admitting: Interventional Cardiology

## 2018-05-01 NOTE — Telephone Encounter (Signed)
Attempted to contact patient to verify if he has been taking the lisinopril or not. There was no answer. Left message for patient to call back.

## 2018-05-01 NOTE — Telephone Encounter (Signed)
Pt's pharmacy is requesting a refill on lisinopril. This medication was D/C. I do not see where Dr. Eldridge DaceVaranasi D/C this medication. Please address

## 2018-05-03 NOTE — Telephone Encounter (Signed)
Attempted to contact patient again to verify if he has been taking the lisinopril or not. There was no answer. Left message for patient to call back. 

## 2018-05-16 NOTE — Telephone Encounter (Signed)
Attempted to contact patient again to verify if he has been taking the lisinopril or not. There was no answer. Left message for patient to call back.

## 2018-05-18 NOTE — Telephone Encounter (Signed)
Attempted to contact patient again to verify if he has been taking the lisinopril or not. There was no answer. Left message for patient to call back. 

## 2019-01-08 ENCOUNTER — Other Ambulatory Visit: Payer: Self-pay | Admitting: *Deleted

## 2019-01-08 DIAGNOSIS — I712 Thoracic aortic aneurysm, without rupture, unspecified: Secondary | ICD-10-CM

## 2019-02-07 ENCOUNTER — Telehealth: Payer: Self-pay | Admitting: *Deleted

## 2019-02-27 ENCOUNTER — Encounter: Payer: Medicare Other | Admitting: Surgery

## 2019-02-27 ENCOUNTER — Other Ambulatory Visit: Payer: Medicare Other

## 2019-03-18 ENCOUNTER — Other Ambulatory Visit: Payer: Self-pay | Admitting: Interventional Cardiology

## 2019-04-03 ENCOUNTER — Other Ambulatory Visit: Payer: Medicare Other

## 2019-04-03 ENCOUNTER — Encounter: Payer: Medicare Other | Admitting: Surgery

## 2019-07-13 IMAGING — CR DG CHEST 2V
2 series · 2 of 2 positions shown · non-contrast
Comparison: 01/11/2018 chest CT.  04/16/2009 chest x-ray.

CLINICAL DATA: 88-year-old male with dizziness earlier today.
Nonsmoker. Initial encounter.

EXAM:
CHEST - 2 VIEW

[chest lat]
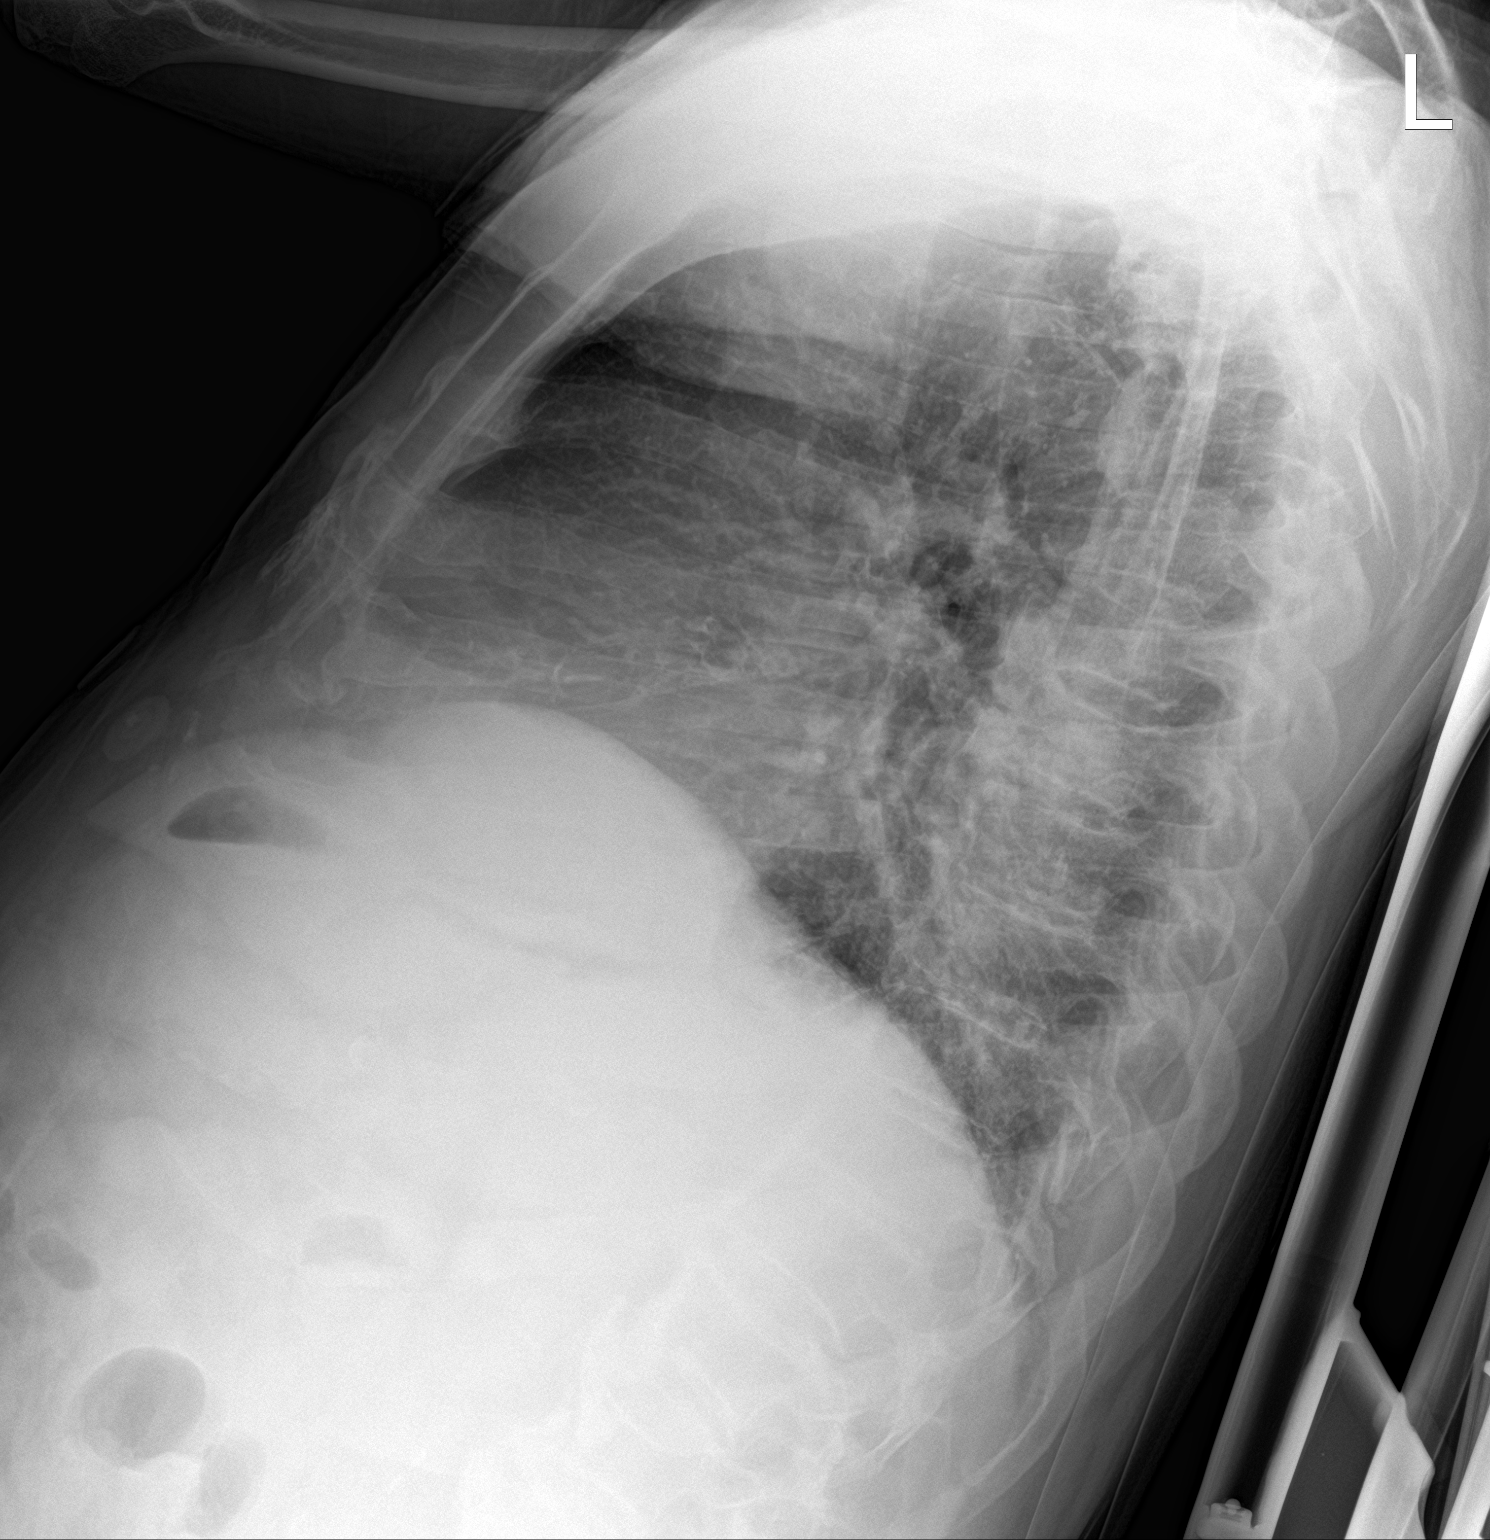

[chest ap]
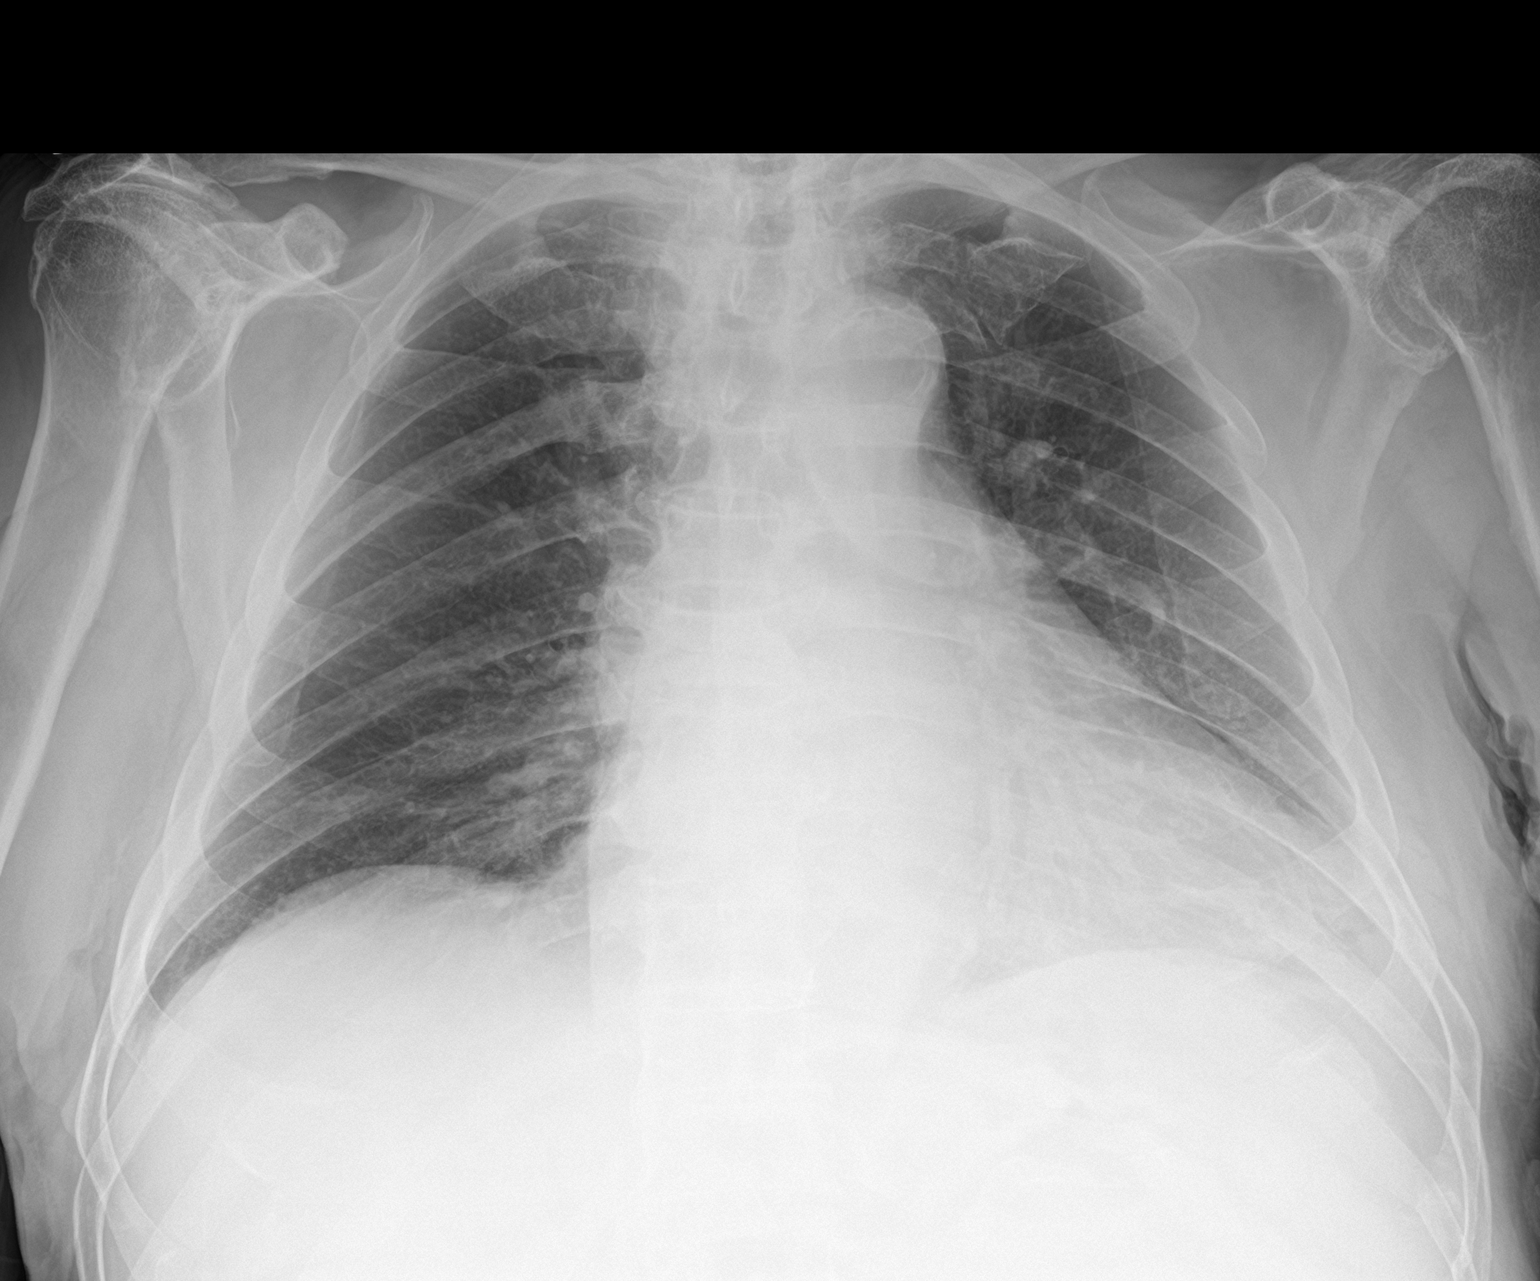

[2 of 2 positions shown; findings below may reference images not displayed]

FINDINGS: Heart size top-normal.

Calcified mildly tortuous aorta.

Central pulmonary vascular prominence without pulmonary edema.

Chronic lung changes without segmental infiltrate. Opacity
projecting over the mid to lower thoracic spine felt to represent
confluence of structures.

No acute osseous abnormality. Bilateral shoulder joint degenerative
changes greater on the right.
IMPRESSION: Top-normal heart size.

Chronic lung changes without acute infiltrate or pulmonary edema.

Aortic Atherosclerosis ([id number].E).

## 2019-09-15 ENCOUNTER — Other Ambulatory Visit: Payer: Self-pay | Admitting: Interventional Cardiology

## 2019-09-17 ENCOUNTER — Other Ambulatory Visit: Payer: Self-pay | Admitting: Interventional Cardiology

## 2019-09-17 MED ORDER — METOPROLOL TARTRATE 25 MG PO TABS: 25.0000 mg | ORAL_TABLET | Freq: Two times a day (BID) | ORAL | 0 refills | Status: DC

## 2019-09-30 ENCOUNTER — Other Ambulatory Visit: Payer: Self-pay | Admitting: Interventional Cardiology

## 2019-11-26 NOTE — Progress Notes (Signed)
Cardiology Office Note   Date:  11/27/2019   ID:  Ryan Tate, DOB 22-Feb-1930, MRN [id number]  PCP:  Kirby Funk, MD    No chief complaint on file.  CAD  Wt Readings from Last 3 Encounters:  11/27/19 191 lb 6.4 oz (86.8 kg)  02/21/18 185 lb (83.9 kg)  02/15/18 182 lb (82.6 kg)       History of Present Illness: Ryan Tate is a 86 y.o. male  with history of CAD status post BMS LAD & RCA in 2011, chronic atrial fibrillation on Coumadin managed by primary care, and 44 mm dilated asending aortic on CT in 2013.  In 3/19, he had removal of several fractured maxillary implants and bone grafting.  He had an episode of dehydration that has resolved.  He tries to stay better hydrated.  In the past,  "He does some "military exercises" twice a day."   Since the last visit, he has felt well.  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    Past Medical History:  Diagnosis Date  . 427.31    afib  . Ascending aorta dilation (HCC)   . BPH (benign prostatic hyperplasia)    Dr. Retta Diones  . Coronary artery disease    2 BMS STENTS LAD AND RCA 2011  . Diverticulosis   . ED (erectile dysfunction)   . History of echocardiogram    2007 Normal LV size, mild LVH, aortic valve sclerosis, mild diastolic relaxation abn and mild aortic root dilation  . History of nuclear stress test    negative-done because of dyspnea  . Hyperlipidemia    borderline  . PVC's (premature ventricular contractions)   . Subclinical hypothyroidism   . Tinnitus    sees ENT    Past Surgical History:  Procedure Laterality Date  . CARDIAC CATHETERIZATION     bms stents LAD and RCA 2011  . cataracts in each eye    . dental implants       Current Outpatient Medications  Medication Sig Dispense Refill  . aspirin 81 MG tablet Take 81 mg by mouth daily.    Marland Kitchen atorvastatin (LIPITOR) 40 MG tablet Take 40 mg by mouth at bedtime.     Marland Kitchen CALCIUM PO Take 1 tablet by mouth daily.    . cholecalciferol  (VITAMIN D) 1000 UNITS tablet Take 1,000 Units by mouth daily.    . meclizine (ANTIVERT) 25 MG tablet Take 1 tablet (25 mg total) by mouth 3 (three) times daily as needed for dizziness. 10 tablet 0  . metoprolol tartrate (LOPRESSOR) 25 MG tablet Take 1 tablet (25 mg total) by mouth 2 (two) times daily. Please keep upcoming appt in January with Dr. Eldridge Dace before anymore refills. Thank you 60 tablet 1  . nitroGLYCERIN (NITROSTAT) 0.4 MG SL tablet Place 1 tablet (0.4 mg total) under the tongue every 5 (five) minutes as needed for chest pain. 30 tablet 1  . vitamin B-12 (CYANOCOBALAMIN) 1000 MCG tablet Take 1,000 mcg by mouth daily.    Marland Kitchen warfarin (COUMADIN) 5 MG tablet Take 2.5-5 mg by mouth See admin instructions. Take 2.5 mg by mouth at 1 PM daily on Sun/Mon/Wed/Thurs/Sat and 5 mg on Tues/Fri     No current facility-administered medications for this visit.    Allergies:   Patient has no known allergies.    Social History:  The patient  reports that he quit smoking about 47 years ago. He quit after 15.00 years of use. He has  never used smokeless tobacco. He reports that he does not drink alcohol.   Family History:  The patient's family history includes Alcoholism in his father.    ROS:  Please see the history of present illness.   Otherwise, review of systems are positive for swollen hand.   All other systems are reviewed and negative.    PHYSICAL EXAM: VS:  BP (!) 152/76   Pulse 66   Ht 5\' 6"  (1.676 m)   Wt 191 lb 6.4 oz (86.8 kg)   SpO2 96%   BMI 30.89 kg/m  , BMI Body mass index is 30.89 kg/m. GEN: Well nourished, well developed, in no acute distress  HEENT: normal  Neck: no JVD, carotid bruits, or masses Cardiac: irregularly irregular; no murmurs, rubs, or gallops,no edema  Respiratory:  clear to auscultation bilaterally, normal work of breathing GI: soft, nontender, nondistended, + BS MS:  swollen left pinky Skin: warm and dry, no rash Neuro:  Strength and sensation are  intact Psych: euthymic mood, full affect   EKG:   The ekg ordered today demonstrates AFib, rate controlled   Recent Labs: No results found for requested labs within last 8760 hours.   Lipid Panel    Component Value Date/Time   CHOL 145 02/05/2015 0735   TRIG 81.0 02/05/2015 0735   HDL 45.30 02/05/2015 0735   CHOLHDL 3 02/05/2015 0735   VLDL 16.2 02/05/2015 0735   LDLCALC 84 02/05/2015 0735     Other studies Reviewed: Additional studies/ records that were reviewed today with results demonstrating: 2019 chest CT reviewed.   ASSESSMENT AND PLAN:  1. CAD: No angina. Continue aggressive secondary prevention.  2. AFib: Rate control.  Continue metoprolol,  and Coumadin for stroke prevention.  3. Aortic aneurysm: Followed with Dr. Cyndia Bent, but did not see in 2020.  Will order labs to see if renal function will be good enough for f/u CT scan.  COuld consider repeat echo if renal function is worse.  4. Hyperlipidemia: LDL 81.  Continue lipid lowering therapy.  5. Bradycardia: HR 66 today.  No sx of bradycardia.   Current medicines are reviewed at length with the patient today.  The patient concerns regarding his medicines were addressed.  The following changes have been made:  No change  Labs/ tests ordered today include:  No orders of the defined types were placed in this encounter.   Recommend 150 minutes/week of aerobic exercise Low fat, low carb, high fiber diet recommended  Disposition:   FU in 1 year   Signed, Larae Grooms, MD  11/27/2019 4:58 PM    [facility] [address], [place], [place]  [address] Phone: [phone]; Fax: [phone]

## 2019-11-27 ENCOUNTER — Ambulatory Visit: Payer: Medicare Other | Admitting: Interventional Cardiology

## 2019-11-27 ENCOUNTER — Other Ambulatory Visit: Payer: Self-pay

## 2019-11-27 ENCOUNTER — Encounter: Payer: Self-pay | Admitting: Interventional Cardiology

## 2019-11-27 VITALS — BP 152/76 | HR 66 | Ht 66.0 in | Wt 191.4 lb

## 2019-11-27 DIAGNOSIS — I2510 Atherosclerotic heart disease of native coronary artery without angina pectoris: Secondary | ICD-10-CM

## 2019-11-27 DIAGNOSIS — Z7901 Long term (current) use of anticoagulants: Secondary | ICD-10-CM

## 2019-11-27 DIAGNOSIS — I4819 Other persistent atrial fibrillation: Secondary | ICD-10-CM | POA: Diagnosis not present

## 2019-11-27 DIAGNOSIS — I712 Thoracic aortic aneurysm, without rupture: Secondary | ICD-10-CM | POA: Diagnosis not present

## 2019-11-27 DIAGNOSIS — I7121 Aneurysm of the ascending aorta, without rupture: Secondary | ICD-10-CM

## 2019-11-27 DIAGNOSIS — E782 Mixed hyperlipidemia: Secondary | ICD-10-CM | POA: Diagnosis not present

## 2019-11-27 NOTE — Patient Instructions (Addendum)
Medication Instructions:  Your physician recommends that you continue on your current medications as directed. Please refer to the Current Medication list given to you today.  *If you need a refill on your cardiac medications before your next appointment, please call your pharmacy*  Lab Work: Your physician recommends that you return for lab work: BMET, CBC before chest CT   If you have labs (blood work) drawn today and your tests are completely normal, you will receive your results only by: Marland Kitchen MyChart Message (if you have MyChart) OR . A paper copy in the mail If you have any lab test that is abnormal or we need to change your treatment, we will call you to review the results.  Testing/Procedures: Please have the chest CT done that Dr. Laneta Simmers ordered  Follow-Up: At [facility], you and your health needs are our priority.  As part of our continuing mission to provide you with exceptional heart care, we have created designated Provider Care Teams.  These Care Teams include your primary Cardiologist (physician) and Advanced Practice Providers (APPs -  Physician Assistants and Nurse Practitioners) who all work together to provide you with the care you need, when you need it.  Your next appointment:   12 month(s)  The format for your next appointment:   In Person  Provider:   You may see Lance Muss, MD or one of the following Advanced Practice Providers on your designated Care Team:    Ronie Spies, PA-C  Jacolyn Reedy, PA-C   Other Instructions We are recommending the COVID-19 vaccine to all of our patients. Cardiac medications (including blood thinners) should not deter anyone from being vaccinated and there is no need to hold any of those medications prior to vaccine administration.   Currently, there is a hotline to call (active 11/22/19) to schedule vaccination appointments as no walk-ins will be accepted.    Vaccines through the health department can be arranged by  calling [phone]    Vaccines through [facility] can be arranged by calling [phone] or visiting [email or web address]   If you have further questions or concerns about the vaccine process, please visit [email or web address], [email or web address], or contact your primary care physician.

## 2019-11-30 ENCOUNTER — Other Ambulatory Visit: Payer: Self-pay | Admitting: Interventional Cardiology

## 2019-12-03 ENCOUNTER — Telehealth: Payer: Self-pay | Admitting: Interventional Cardiology

## 2019-12-03 NOTE — Telephone Encounter (Signed)
See telephone note  - Patient ask me to cancel the Ct of Chest that was schedule for GI and also the blood work.  He don't want to do.

## 2019-12-03 NOTE — Telephone Encounter (Signed)
Have decided not to do the CT of Chest at GI and blood work.

## 2019-12-04 NOTE — Telephone Encounter (Signed)
Attempted to contact patient several times and phone keeps disconnecting.

## 2019-12-05 ENCOUNTER — Other Ambulatory Visit: Payer: Medicare Other

## 2019-12-10 ENCOUNTER — Other Ambulatory Visit: Payer: Medicare Other

## 2019-12-31 NOTE — Telephone Encounter (Signed)
Attempted to follow up with this patient, but there was no answer. Left message for patient to call back.

## 2020-01-10 NOTE — Telephone Encounter (Signed)
Left message to call back  

## 2020-06-14 DEATH — deceased
